# Patient Record
Sex: Male | Born: 1970 | Race: White | Hispanic: Yes | Marital: Married | Smoking: Never smoker
Health system: Southern US, Community
[De-identification: ages and names within clinical notes are randomized; demographics above are authoritative.]

## PROBLEM LIST (undated history)

## (undated) DIAGNOSIS — E119 Type 2 diabetes mellitus without complications: Secondary | ICD-10-CM

## (undated) DIAGNOSIS — I10 Essential (primary) hypertension: Secondary | ICD-10-CM

## (undated) DIAGNOSIS — E7800 Pure hypercholesterolemia, unspecified: Secondary | ICD-10-CM

## (undated) HISTORY — PX: SHOULDER OPEN ROTATOR CUFF REPAIR: SHX2407

## (undated) HISTORY — PX: BACK SURGERY: SHX140

## (undated) HISTORY — PX: KNEE SURGERY: SHX244

---

## 1999-11-16 ENCOUNTER — Ambulatory Visit (HOSPITAL_COMMUNITY): Admission: RE | Admit: 1999-11-16 | Discharge: 1999-11-16 | Payer: Self-pay | Admitting: Family Medicine

## 1999-11-16 ENCOUNTER — Encounter: Payer: Self-pay | Admitting: Family Medicine

## 2000-01-12 ENCOUNTER — Encounter: Payer: Self-pay | Admitting: Orthopedic Surgery

## 2000-01-12 ENCOUNTER — Encounter: Admission: RE | Admit: 2000-01-12 | Discharge: 2000-01-12 | Payer: Self-pay | Admitting: Orthopedic Surgery

## 2001-11-29 ENCOUNTER — Encounter: Payer: Self-pay | Admitting: Emergency Medicine

## 2001-11-29 ENCOUNTER — Emergency Department (HOSPITAL_COMMUNITY): Admission: EM | Admit: 2001-11-29 | Discharge: 2001-11-29 | Payer: Self-pay | Admitting: Emergency Medicine

## 2002-06-14 ENCOUNTER — Emergency Department (HOSPITAL_COMMUNITY): Admission: AC | Admit: 2002-06-14 | Discharge: 2002-06-14 | Payer: Self-pay

## 2002-06-14 ENCOUNTER — Encounter: Payer: Self-pay | Admitting: Emergency Medicine

## 2002-11-07 ENCOUNTER — Encounter: Payer: Self-pay | Admitting: Family Medicine

## 2002-11-07 ENCOUNTER — Encounter: Admission: RE | Admit: 2002-11-07 | Discharge: 2002-11-07 | Payer: Self-pay | Admitting: Family Medicine

## 2005-09-06 ENCOUNTER — Emergency Department (HOSPITAL_COMMUNITY): Admission: EM | Admit: 2005-09-06 | Discharge: 2005-09-07 | Payer: Self-pay | Admitting: Emergency Medicine

## 2006-09-17 ENCOUNTER — Emergency Department (HOSPITAL_COMMUNITY): Admission: EM | Admit: 2006-09-17 | Discharge: 2006-09-17 | Payer: Self-pay | Admitting: Emergency Medicine

## 2007-01-31 ENCOUNTER — Inpatient Hospital Stay (HOSPITAL_COMMUNITY): Admission: RE | Admit: 2007-01-31 | Discharge: 2007-02-02 | Payer: Self-pay | Admitting: Neurosurgery

## 2007-05-10 ENCOUNTER — Encounter: Admission: RE | Admit: 2007-05-10 | Discharge: 2007-05-10 | Payer: Self-pay | Admitting: Gastroenterology

## 2011-04-14 NOTE — Op Note (Signed)
[name], [name]                 ACCOUNT NO.:  [id number]   MEDICAL RECORD NO.:  [id number]          PATIENT TYPE:  INP   LOCATION:  3172                         FACILITY:  [facility]   PHYSICIAN:  Reinaldo Meeker, M.D. DATE OF BIRTH:  [birth year]   DATE OF PROCEDURE:  01/31/2007  DATE OF DISCHARGE:                               OPERATIVE REPORT   PREOPERATIVE DIAGNOSIS:  Herniated disc and degenerative disc disease L4-  L5 and L5-S1.   POSTOPERATIVE DIAGNOSIS:  Herniated disc and degenerative disc disease  L4-L5 and L5-S1.   PROCEDURE:  L4-L5 and L5-S1 bilateral complete laminectomy followed by  L4-L5 and L5-S1 bilateral microdiscectomy followed by L4-L5 and L5-S1  posterior lumbar interbody fusion with Nuvasive bony spacers and PEEK  interbody cage at each level followed by segmental pedicle screw  sensation L4-L5 and L5-S1 followed by L4-L5 and L5-S1 posterolateral  fusion.   SECONDARY PROCEDURE:  Microsection L4-L5 and L5-S1 disc as well as L4,  L5, and S1 nerve roots.   SURGEON:  Reinaldo Meeker, M.D.   ASSISTANT:  Donalee Citrin, M.D.   PROCEDURE IN DETAIL:  After being placed in the prone position, the  patient's back was prepped and draped in the usual sterile fashion.  Localizing fluoroscopy was used prior to incision to identify the  appropriate level.  A midline incision was made over the spinous process  of L3, L4, L5, and S1.  Using Bovie cutting current, the incision was  carried down to the spinous processes.  Subperiosteal dissection was  then carried out bilaterally in the spinous process and lamina facet  joint to expose the transverse processes of L4 and L5 as well as the far  lateral region of S1.  A self-retaining retractor was placed for  exposure and x-rays showed we approached the appropriate level.  The  spinous processes of L4, L5, and S1 were then removed and the bone saved  for use later in the case.   Starting on the patient left side, generous  laminotomy was performed at  L4-L5 and L5-S1 by removing the inferior 80% of the L4 lamina, the  medial 2/3 of the facet joint, all of the L5 lamina, and superior 1/2 of  the S1 lamina.  Residual bone was removed and saved for use later in the  case.  The ligamentum flavum was removed in a piecemeal fashion.  L5 and  S1 nerve roots were identified easily.  Dissection was then carried out  further to identify the L4 nerve and more decompression was carried out  to do this than was needed for the posterior lumbar interbody fusion.  A  similar decompression was then carried out on the opposite side, once  again removing the inferior 80% of the L4 lamina, the medial 2/3 of the  facet joint at L4, L5, and S1, as well as all of the L5 lamina and the  superior 1/2 of the S1 lamina.  Once again, the L4, L5, and S1 nerve  roots were identified and more decompression was carried out to identify  the L4 than was  needed simply for the posterior lumbar interbody fusion.   At this time, bilateral microdiscectomy was carried out at L4-L5 and L5-  S1.  The annulus was incised after coagulation and thoroughly cleaned  out with pituitary rongeurs and curets.  A similar decompression was  carried out bilaterally at both levels until the disc space was well  decompressed as was the nerve roots and the discs were then prepared for  posterior lumbar interbody fusion.  Starting at L4-L5, the disc was  distracted up to a 10 mm size.  On the opposite side, a rotating cutter  followed by box chiseling with a 10 x 9 chisel was carried out.  A bony  spacer of 10 x 9 x 25 mm depth was placed and found to be in good  position under fluoroscopy.  On the second side, a similar preparation  was carried out.  Prior to placing the second spacer, autologous bone  and BMP were placed in the midline.  On the second side, a PEEK cage  with autologous bone and putty were placed.   Attention was then turned to L5-S1.  Once  again, the disc was distracted  up to a 10 mm size.  Once again, scraping and chiseling was carried out  followed by placing first a cage and second, the bony spacer on the  opposite side.  Once again, prior to placing the second spacer,  autologous bone, Actifuse putty, and BMP were placed in the midline.  Once again, fluoroscopy showed the spacers to be in good position.  Once  again, a PEEK spacer on one side and a bony spacer on the other were  used.  Pedicle screw fixation was then carried out.  Drill holes were  placed for starting followed by placement of an awl, tapping, and  placing screws.  45 mm screws were placed bilaterally at L4.  On the  right side at L5, a 45 mm screws was placed, and a 40 mm screw was  placed on the left.  35 mm screws were placed at S1 bilaterally.   After irrigating once more, posterolateral fusion was carried out.  A  high speed drill was used to decorticate the far lateral region and a  mixture of Actifuse, autologous bone, and BMP were placed.  The rods  were then placed on the screw heads and sequentially tightened.  Sequential compression was carried out bilaterally.  A crosslink was  then placed.  Final fluoroscopy in AP and lateral direction showed the  screws, rods, and crosslink to all be in good position as well as the  bony spacers.  Large amounts of irrigation were carried out at this  time.  Any bleeding was controlled with bipolar coagulation and Gelfoam.  A Hemovac drain was brought out through a separate stab wound incision  and left in the epidural space.  The wound was then closed in multiple  layers of Vicryl in the muscle, fascia, subcutaneous, and subcuticular  tissues.  Staples were placed on the skin.  Sterile dressings were then  applied and the patient was extubated and taken to the recovery room in  stable condition.           ______________________________  Reinaldo Meeker, M.D.    ROK/MEDQ  D:  01/31/2007  T:   01/31/2007  Job:  [id number]

## 2016-08-11 ENCOUNTER — Encounter (HOSPITAL_COMMUNITY): Payer: Self-pay | Admitting: Emergency Medicine

## 2016-08-11 ENCOUNTER — Emergency Department (HOSPITAL_COMMUNITY): Payer: BC Managed Care – PPO

## 2016-08-11 ENCOUNTER — Emergency Department (HOSPITAL_COMMUNITY)
Admission: EM | Admit: 2016-08-11 | Discharge: 2016-08-11 | Disposition: A | Discharge status: Home / Self Care | Payer: BC Managed Care – PPO | Attending: Emergency Medicine | Admitting: Emergency Medicine

## 2016-08-11 DIAGNOSIS — E119 Type 2 diabetes mellitus without complications: Secondary | ICD-10-CM | POA: Diagnosis not present

## 2016-08-11 DIAGNOSIS — Z7984 Long term (current) use of oral hypoglycemic drugs: Secondary | ICD-10-CM | POA: Insufficient documentation

## 2016-08-11 DIAGNOSIS — I10 Essential (primary) hypertension: Secondary | ICD-10-CM | POA: Insufficient documentation

## 2016-08-11 DIAGNOSIS — R079 Chest pain, unspecified: Secondary | ICD-10-CM | POA: Insufficient documentation

## 2016-08-11 HISTORY — DX: Essential (primary) hypertension: I10

## 2016-08-11 HISTORY — DX: Type 2 diabetes mellitus without complications: E11.9

## 2016-08-11 HISTORY — DX: Pure hypercholesterolemia, unspecified: E78.00

## 2016-08-11 LAB — I-STAT TROPONIN, ED
Troponin i, poc: 0.01 ng/mL (ref 0.00–0.08)
Troponin i, poc: 0.01 ng/mL (ref 0.00–0.08)

## 2016-08-11 LAB — BASIC METABOLIC PANEL
Anion gap: 12 (ref 5–15)
BUN: 9 mg/dL (ref 6–20)
CO2: 25 mmol/L (ref 22–32)
Calcium: 9.9 mg/dL (ref 8.9–10.3)
Chloride: 103 mmol/L (ref 101–111)
Creatinine, Ser: 0.86 mg/dL (ref 0.61–1.24)
GFR calc Af Amer: >60 mL/min (ref >60)
GFR calc non Af Amer: >60 mL/min (ref >60)
Glucose, Bld: 153 mg/dL — ABNORMAL HIGH (ref 65–99)
Potassium: 3.5 mmol/L (ref 3.5–5.1)
Sodium: 140 mmol/L (ref 135–145)

## 2016-08-11 LAB — CBC
HCT: 42.6 % (ref 39.0–52.0)
Hemoglobin: 14 g/dL (ref 13.0–17.0)
MCH: 25.2 pg — ABNORMAL LOW (ref 26.0–34.0)
MCHC: 32.9 g/dL (ref 30.0–36.0)
MCV: 76.8 fL — ABNORMAL LOW (ref 78.0–100.0)
Platelets: 257 10*3/uL (ref 150–400)
RBC: 5.55 MIL/uL (ref 4.22–5.81)
RDW: 14.5 % (ref 11.5–15.5)
WBC: 10 10*3/uL (ref 4.0–10.5)

## 2016-08-11 MED ORDER — ASPIRIN 81 MG PO CHEW
324.0000 mg | CHEWABLE_TABLET | Freq: Once | ORAL | Status: AC
  Administered 2016-08-11: 324 mg via ORAL
  Filled 2016-08-11: qty 4

## 2016-08-11 NOTE — ED Notes (Signed)
Pt departed in NAD, refused use of wheelchair.  

## 2016-08-11 NOTE — ED Triage Notes (Signed)
Mid cp that started this am a little sob , pain comes and goes no n/v

## 2016-08-11 NOTE — ED Provider Notes (Signed)
[facility] Provider Note   CSN: [id number] Arrival date & time: 08/11/16  1408     History   Chief Complaint Chief Complaint  Patient presents with  . Chest Pain    HPI [name] is a 45 y.o. male.  The history is provided by the patient.  Chest Pain   This is a new problem. The current episode started 12 to 24 hours ago. The problem occurs constantly. The problem has not changed since onset.Pain location: left chest. The pain is mild. The quality of the pain is described as pressure-like. The pain does not radiate. Episode Length: several hours. Associated symptoms include shortness of breath. Pertinent negatives include no cough, no fever, no hemoptysis, no lower extremity edema, no syncope and no vomiting. Risk factors include male gender.  His past medical history is significant for diabetes, hyperlipidemia and hypertension.  Pertinent negatives for past medical history include no CAD and no PE.  His family medical history is significant for early MI.   Pt reports onset of left sided well localized chest pressure Worse with breathing It is not worse with exertion No fever/vomiting He reports mild SOB No h/o CAD/PE  fam hx - +for CAD Past Medical History:  Diagnosis Date  . Diabetes mellitus without complication (HCC)   . High cholesterol   . Hypertension     There are no active problems to display for this patient.   Past Surgical History:  Procedure Laterality Date  . BACK SURGERY    . SHOULDER OPEN ROTATOR CUFF REPAIR         Home Medications    Prior to Admission medications   Medication Sig Start Date End Date Taking? Authorizing Provider  fexofenadine (ALLEGRA) 180 MG tablet Take 180 mg by mouth daily. 07/17/16  Yes Historical Provider, MD  JANUVIA 100 MG tablet Take 100 mg by mouth daily. 07/09/16  Yes Historical Provider, MD  metFORMIN (GLUCOPHAGE) 1000 MG tablet Take 1,000 mg by mouth 2 (two) times daily. 07/22/16  Yes Historical  Provider, MD  nisoldipine (SULAR) 20 MG 24 hr tablet Take 20 mg by mouth daily. 07/02/16  Yes Historical Provider, MD  rosuvastatin (CRESTOR) 5 MG tablet Take 5 mg by mouth at bedtime. 07/02/16  Yes Historical Provider, MD  valsartan-hydrochlorothiazide (DIOVAN-HCT) 160-25 MG tablet Take 1 tablet by mouth daily. 07/26/16  Yes Historical Provider, MD    Family History No family history on file.  Social History Social History  Substance Use Topics  . Smoking status: Never Smoker  . Smokeless tobacco: Never Used  . Alcohol use Not on file     Allergies   Morphine and related   Review of Systems Review of Systems  Constitutional: Negative for fever.  Respiratory: Positive for shortness of breath. Negative for cough and hemoptysis.   Cardiovascular: Positive for chest pain. Negative for syncope.  Gastrointestinal: Negative for vomiting.  All other systems reviewed and are negative.    Physical Exam Updated Vital Signs BP 137/93   Pulse (!) 52   Temp 97.7 F (36.5 C) (Oral)   Resp 17   SpO2 100%   Physical Exam  CONSTITUTIONAL: Well developed/well nourished HEAD: Normocephalic/atraumatic EYES: EOMI/PERRL ENMT: Mucous membranes moist NECK: supple no meningeal signs SPINE/BACK:entire spine nontender CV: S1/S2 noted, no murmurs/rubs/gallops noted LUNGS: Lungs are clear to auscultation bilaterally, no apparent distress ABDOMEN: soft, nontender, no rebound or guarding, bowel sounds noted throughout abdomen GU:no cva tenderness NEURO: Pt is awake/alert/appropriate, moves all extremitiesx4.  No facial droop.   EXTREMITIES: pulses normal/equalx4, full ROM, no calf tenderness or edema SKIN: warm, color normal PSYCH: no abnormalities of mood noted, alert and oriented to situation  ED Treatments / Results  Labs (all labs ordered are listed, but only abnormal results are displayed) Labs Reviewed  BASIC METABOLIC PANEL - Abnormal; Notable for the following:       Result Value     Glucose, Bld 153 (*)    All other components within normal limits  CBC - Abnormal; Notable for the following:    MCV 76.8 (*)    MCH 25.2 (*)    All other components within normal limits  I-STAT TROPOININ, ED  I-STAT TROPOININ, ED    EKG  EKG Interpretation  Date/Time:  Friday August 11 2016 14:17:30 EDT Ventricular Rate:  77 PR Interval:  146 QRS Duration: 114 QT Interval:  382 QTC Calculation: 432 R Axis:   -30 Text Interpretation:  Normal sinus rhythm Left axis deviation Incomplete right bundle branch block Abnormal ECG No significant change since last tracing Confirmed by Bebe ShaggyWICKLINE  MD, DONALD ([id number]) on 08/11/2016 6:49:47 PM       Radiology Dg Chest 2 View  Result Date: 08/11/2016 CLINICAL DATA:  Chest pain beginning this morning. EXAM: CHEST  2 VIEW COMPARISON:  PA and lateral chest 09/17/2016. FINDINGS: The lungs are clear. Heart size is normal. No pneumothorax or pleural effusion. No bony abnormality. IMPRESSION: No acute disease. Electronically Signed   By: Drusilla Kannerhomas  Dalessio M.D.   On: 08/11/2016 15:06    Procedures Procedures (including critical care time)  Medications Ordered in ED Medications  aspirin chewable tablet 324 mg (324 mg Oral Given 08/11/16 2023)     Initial Impression / Assessment and Plan / ED Course  I have reviewed the triage vital signs and the nursing notes.  Pertinent labs & imaging results that were available during my care of the patient were reviewed by me and considered in my medical decision making (see chart for details).  Clinical Course    Pt with HEART score of 3 PERC negative   Pt stable in the ED Repeat troponin unchanged No worsening CP He has PCP f/u in 2 days Discussed strict return precautions Will d/c home   Final Clinical Impressions(s) / ED Diagnoses   Final diagnoses:  Nonspecific chest pain    New Prescriptions New Prescriptions   No medications on file     Zadie Rhineonald Wickline, MD 08/11/16 2317

## 2016-08-11 NOTE — Discharge Instructions (Signed)

## 2017-06-22 IMAGING — DX DG CHEST 2V
2 series · 2 of 2 positions shown · non-contrast
Comparison: PA and lateral chest 09/17/2016.

CLINICAL DATA: Chest pain beginning this morning.

EXAM:
CHEST  2 VIEW

[w chest pa]
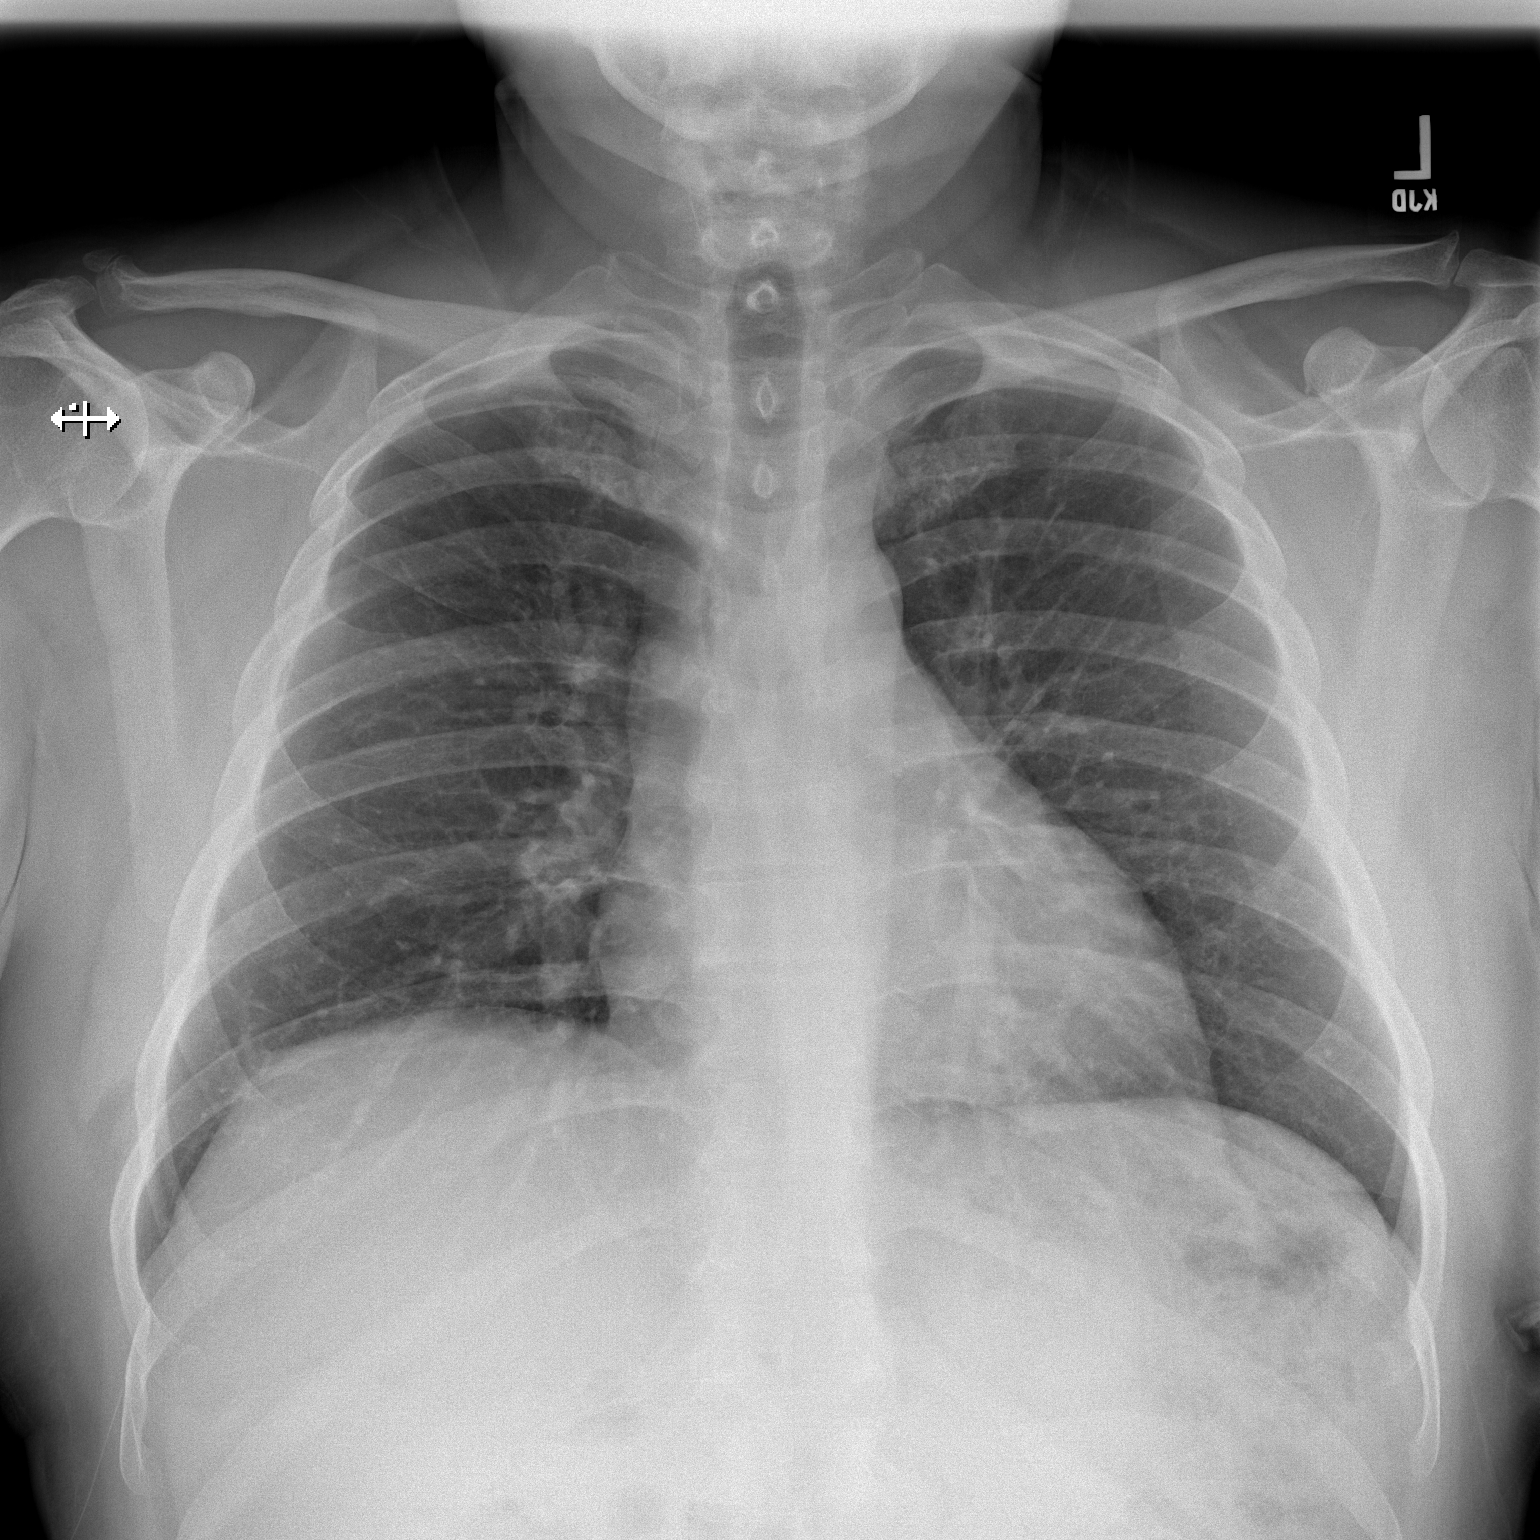

[w chest lat]
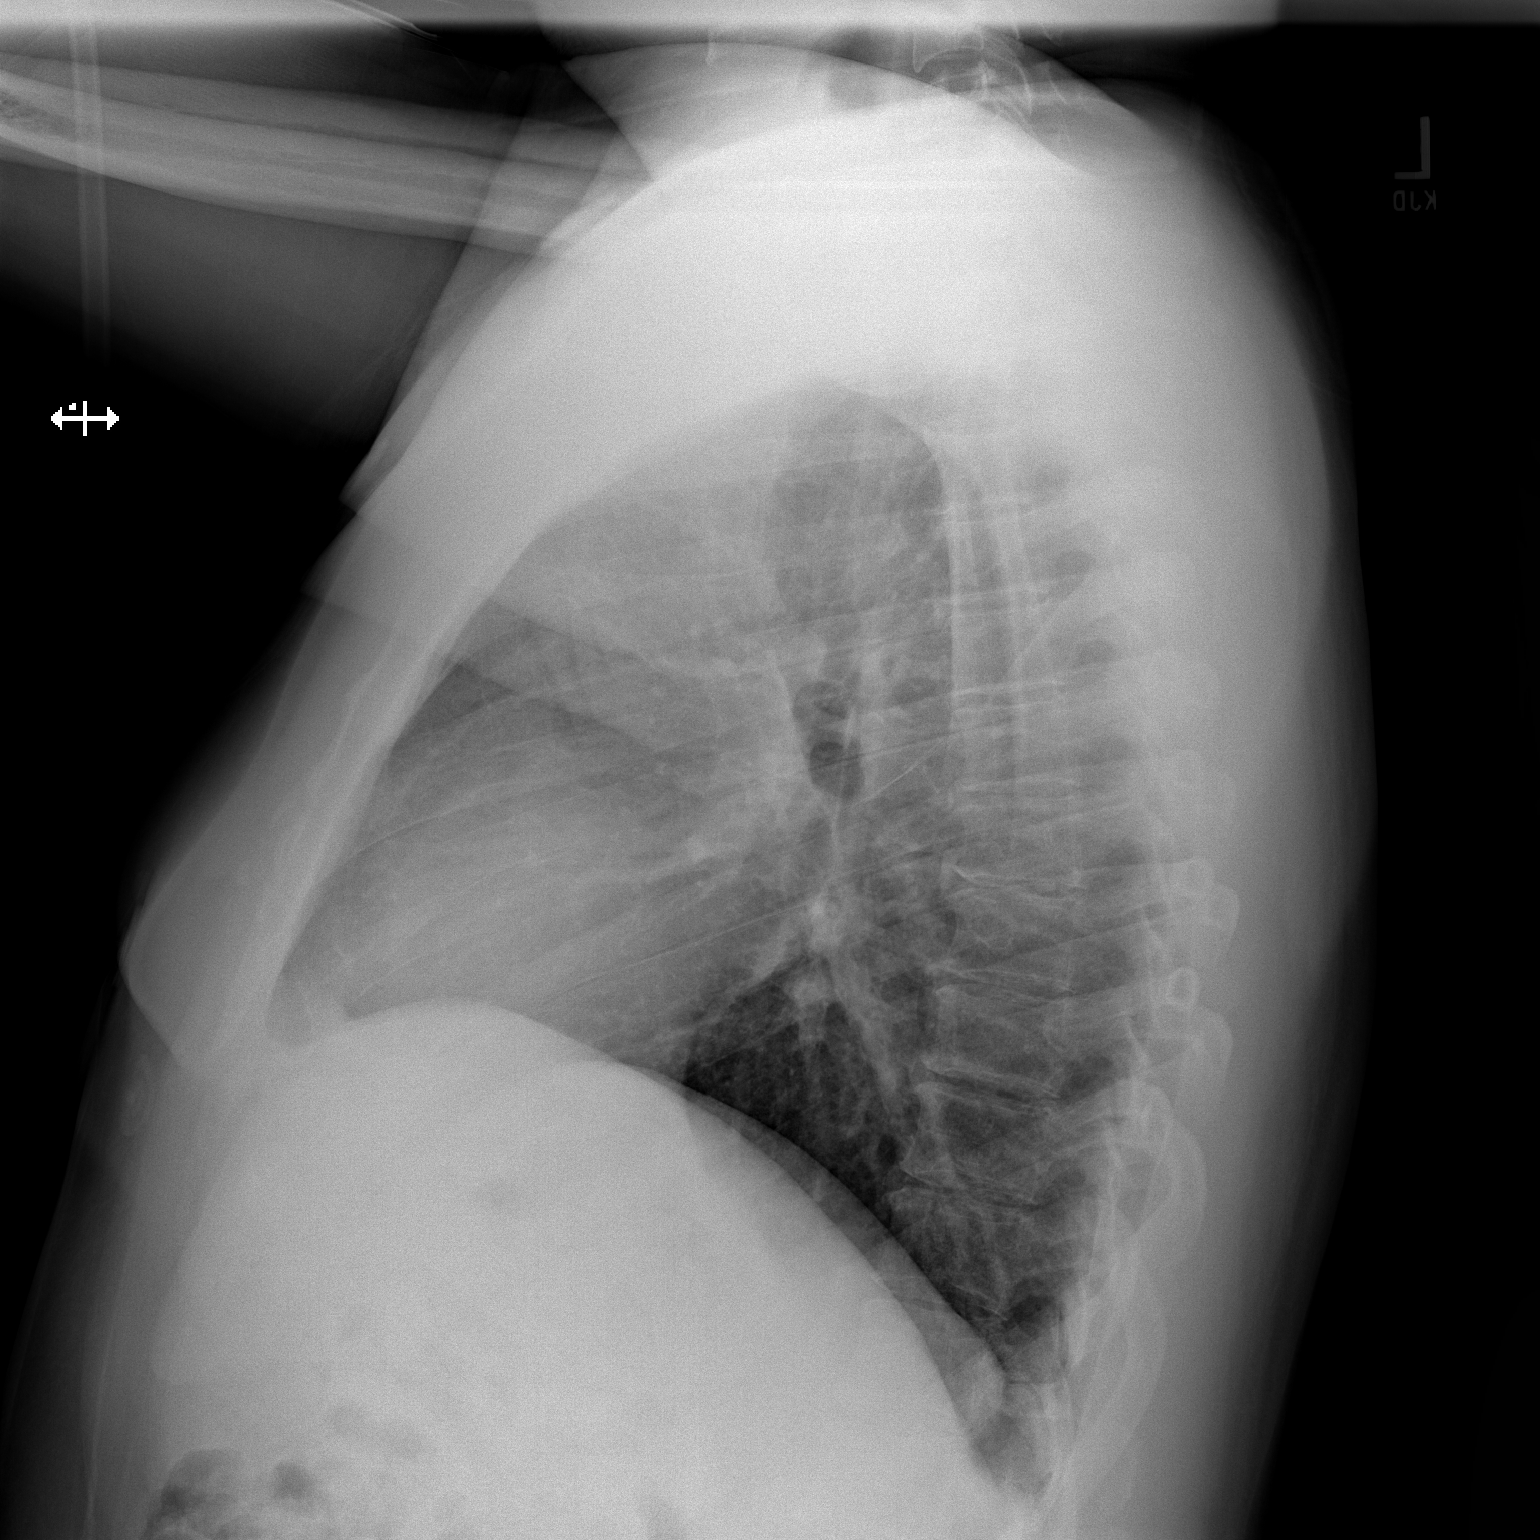

[2 of 2 positions shown; findings below may reference images not displayed]

FINDINGS: The lungs are clear. Heart size is normal. No pneumothorax or
pleural effusion. No bony abnormality.
IMPRESSION: No acute disease.

## 2018-11-21 ENCOUNTER — Other Ambulatory Visit (HOSPITAL_COMMUNITY)
Admission: RE | Admit: 2018-11-21 | Discharge: 2018-11-21 | Disposition: A | Discharge status: Home / Self Care | Payer: BC Managed Care – PPO | Source: Ambulatory Visit | Attending: Family Medicine | Admitting: Family Medicine

## 2018-11-21 ENCOUNTER — Other Ambulatory Visit: Payer: Self-pay

## 2018-11-21 DIAGNOSIS — Z113 Encounter for screening for infections with a predominantly sexual mode of transmission: Secondary | ICD-10-CM | POA: Diagnosis present

## 2018-11-25 LAB — URINE CYTOLOGY ANCILLARY ONLY
Bacterial vaginitis: NEGATIVE
Candida vaginitis: NEGATIVE
Chlamydia: NEGATIVE
Neisseria Gonorrhea: NEGATIVE
Trichomonas: NEGATIVE

## 2020-02-18 ENCOUNTER — Other Ambulatory Visit: Payer: Self-pay

## 2020-02-18 ENCOUNTER — Ambulatory Visit: Payer: BC Managed Care – PPO | Admitting: Medical

## 2020-02-18 ENCOUNTER — Encounter: Payer: Self-pay | Admitting: Medical

## 2020-02-18 VITALS — BP 154/100 | HR 71 | Temp 98.0°F | Ht 66.0 in | Wt 216.4 lb

## 2020-02-18 DIAGNOSIS — I10 Essential (primary) hypertension: Secondary | ICD-10-CM

## 2020-02-18 DIAGNOSIS — IMO0002 Reserved for concepts with insufficient information to code with codable children: Secondary | ICD-10-CM | POA: Insufficient documentation

## 2020-02-18 DIAGNOSIS — E782 Mixed hyperlipidemia: Secondary | ICD-10-CM | POA: Insufficient documentation

## 2020-02-18 DIAGNOSIS — E118 Type 2 diabetes mellitus with unspecified complications: Secondary | ICD-10-CM

## 2020-02-18 DIAGNOSIS — E785 Hyperlipidemia, unspecified: Secondary | ICD-10-CM

## 2020-02-18 DIAGNOSIS — E1165 Type 2 diabetes mellitus with hyperglycemia: Secondary | ICD-10-CM

## 2020-02-18 MED ORDER — VALSARTAN-HYDROCHLOROTHIAZIDE 320-12.5 MG PO TABS: 1.0000 | ORAL_TABLET | Freq: Every day | ORAL | 1 refills | Status: DC

## 2020-02-18 MED ORDER — FARXIGA 5 MG PO TABS: 5.0000 mg | ORAL_TABLET | Freq: Every day | ORAL | 0 refills | Status: DC

## 2020-02-18 MED ORDER — AMLODIPINE BESYLATE 10 MG PO TABS: 10.0000 mg | ORAL_TABLET | Freq: Every day | ORAL | 1 refills | Status: DC

## 2020-02-18 MED ORDER — METFORMIN HCL 1000 MG PO TABS: 1000.0000 mg | ORAL_TABLET | Freq: Two times a day (BID) | ORAL | 3 refills | Status: DC

## 2020-02-18 NOTE — Progress Notes (Signed)
Subjective: Chief Complaint  Patient presents with  . New Patient (Initial Visit)    establish care   . Hypertension  . Diabetes    with fasting blood work    New patient today.  Was seeing Dr. Darlyne Russian prior  He notes history of diabetes x10 years, history of high blood pressure for 15 years.  For the past 2 years his blood pressure has not improved despite medication changes.  Want to get another opinion.  He is having some trouble getting medications and supplies refilled.  Not currently checking blood sugars.  He ran out of many of his medicines in January 2021.  He exercises, tries to eat healthy  Last cardiology visit was years ago.  Home blood pressures typically 160/90  His last hemoglobin A1c was 9% in February 2021  Other medical team includes allergy Dr., And sees physical therapy for his back.  He sees Dr. Latanya Maudlin for orthopedics.  He had a shoulder steroid injection recently.  He is status post prior back surgery    Past Medical History:  Diagnosis Date  . Diabetes mellitus without complication ([place])   . High cholesterol   . Hypertension    Current Outpatient Medications on File Prior to Visit  Medication Sig Dispense Refill  . amLODipine (NORVASC) 5 MG tablet Take 5 mg by mouth daily.    . valsartan-hydrochlorothiazide (DIOVAN-HCT) 160-25 MG tablet Take 1 tablet by mouth daily.  3  . fexofenadine (ALLEGRA) 180 MG tablet Take 180 mg by mouth daily.  4  . JANUVIA 100 MG tablet Take 100 mg by mouth daily.  3  . metFORMIN (GLUCOPHAGE) 1000 MG tablet Take 1,000 mg by mouth 2 (two) times daily.  3  . nisoldipine (SULAR) 20 MG 24 hr tablet Take 20 mg by mouth daily.  4  . rosuvastatin (CRESTOR) 5 MG tablet Take 5 mg by mouth at bedtime.  1   No current facility-administered medications on file prior to visit.   Review of systems as in subjective    Objective BP (!) 154/100   Pulse 71   Temp 98 F (36.7 C)   Ht 5\' 6"  (1.676 m)   Wt 216 lb 6.4 oz (98.2 kg)    SpO2 97%   BMI 34.93 kg/m   General appearence: alert, no distress, WD/WN, [place] American male, very pleasant Neck: supple, no lymphadenopathy, no thyromegaly, no masses, no bruit Heart: RRR, normal S1, S2, no murmurs Lungs: CTA bilaterally, no wheezes, rhonchi, or rales Abdomen: +bs, soft, non tender, non distended, no masses, no hepatomegaly, no splenomegaly Pulses: 2+ symmetric, upper and lower extremities, normal cap refill Extremities no edema    Assessment: Encounter Diagnoses  Name Primary?  . Diabetes mellitus type 2 with complications, uncontrolled ([place]) Yes  . Essential hypertension, benign   . Hyperlipidemia, unspecified hyperlipidemia type     Plan: We discussed his health history, medications.  Discussed the problem he has had getting blood pressure under control.  Discussed diet, exercise, need to continue efforts to lose weight.  Limit salt intake.  I will increase doses of both blood pressure medications today  Refilled Farxiga and Metformin as he has been out of these.  Advised he get back to glucose testing.  He has glucose testing supplies at home    [facility] was seen today for new patient (initial visit), hypertension and diabetes.  Diagnoses and all orders for this visit:  Diabetes mellitus type 2 with complications, uncontrolled ([place]) -  Comprehensive metabolic panel -     Hemoglobin A1c  Essential hypertension, benign -     Comprehensive metabolic panel  Hyperlipidemia, unspecified hyperlipidemia type  Other orders -     valsartan-hydrochlorothiazide (DIOVAN-HCT) 320-12.5 MG tablet; Take 1 tablet by mouth daily. -     amLODipine (NORVASC) 10 MG tablet; Take 1 tablet (10 mg total) by mouth daily. -     metFORMIN (GLUCOPHAGE) 1000 MG tablet; Take 1 tablet (1,000 mg total) by mouth 2 (two) times daily with a meal. -     dapagliflozin propanediol (FARXIGA) 5 MG TABS tablet; Take 5 mg by mouth daily before breakfast.  Follow-up pending labs, likely  recheck in 1 to 3 months

## 2020-02-19 ENCOUNTER — Other Ambulatory Visit: Payer: Self-pay | Admitting: Medical

## 2020-02-19 LAB — COMPREHENSIVE METABOLIC PANEL
ALT: 41 IU/L (ref 0–44)
AST: 27 IU/L (ref 0–40)
Albumin/Globulin Ratio: 2 (ref 1.2–2.2)
Albumin: 4.9 g/dL (ref 4.0–5.0)
Alkaline Phosphatase: 102 IU/L (ref 39–117)
BUN/Creatinine Ratio: 10 (ref 9–20)
BUN: 9 mg/dL (ref 6–24)
Bilirubin Total: 0.5 mg/dL (ref 0.0–1.2)
CO2: 27 mmol/L (ref 20–29)
Calcium: 9.6 mg/dL (ref 8.7–10.2)
Chloride: 97 mmol/L (ref 96–106)
Creatinine, Ser: 0.9 mg/dL (ref 0.76–1.27)
GFR calc Af Amer: 116 mL/min/{1.73_m2} (ref >59)
GFR calc non Af Amer: 100 mL/min/{1.73_m2} (ref >59)
Globulin, Total: 2.4 g/dL (ref 1.5–4.5)
Glucose: 248 mg/dL — ABNORMAL HIGH (ref 65–99)
Potassium: 3.4 mmol/L — ABNORMAL LOW (ref 3.5–5.2)
Sodium: 141 mmol/L (ref 134–144)
Total Protein: 7.3 g/dL (ref 6.0–8.5)

## 2020-02-19 LAB — HEMOGLOBIN A1C
Est. average glucose Bld gHb Est-mCnc: 214 mg/dL
Hgb A1c MFr Bld: 9.1 % — ABNORMAL HIGH (ref 4.8–5.6)

## 2020-02-19 MED ORDER — JANUVIA 100 MG PO TABS: 100.0000 mg | ORAL_TABLET | Freq: Every day | ORAL | 0 refills | Status: DC

## 2020-02-19 MED ORDER — ROSUVASTATIN CALCIUM 5 MG PO TABS: 5.0000 mg | ORAL_TABLET | Freq: Every day | ORAL | 3 refills | Status: DC

## 2020-02-19 NOTE — Progress Notes (Unsigned)
Labs show blood sugar over 240, hemoglobin A1c 9.1%  Recommendations  Diabetes  Continue Metformin 1000 mg twice daily  For now, continue Januvia 100 mg daily  Add the [place] 5 mg once daily.  If this is too expensive let me know right away.  The benefits of this medicine is to improve sugars, to help lose weight, and to reduce risk of heart disease  I recommend he be drinking at least 2 L of water per day particular with the [place]  Eat a low-carb low-fat diet and get exercise regularly  High blood pressure  Yesterday I increased the amlodipine to 10 mg daily  Yesterday I changed his other blood pressure pill to 320/12.5 mg daily valsartan HCT  So stop the current doses and switch to these  High cholesterol I refilled Crestor 5 mg daily at bedtime  Start this regimen, check blood sugars at least a few days per week fasting with goal less than 130  Lets follow-up in 6 to 8 weeks either for med check or fasting physical

## 2020-03-22 ENCOUNTER — Encounter: Payer: Self-pay | Admitting: Medical

## 2020-03-22 ENCOUNTER — Other Ambulatory Visit: Payer: Self-pay

## 2020-03-22 ENCOUNTER — Ambulatory Visit: Payer: BC Managed Care – PPO | Admitting: Medical

## 2020-03-22 VITALS — BP 140/88 | HR 79 | Temp 98.5°F | Ht 66.0 in | Wt 223.6 lb

## 2020-03-22 DIAGNOSIS — N529 Male erectile dysfunction, unspecified: Secondary | ICD-10-CM | POA: Diagnosis not present

## 2020-03-22 DIAGNOSIS — E785 Hyperlipidemia, unspecified: Secondary | ICD-10-CM | POA: Diagnosis not present

## 2020-03-22 DIAGNOSIS — I10 Essential (primary) hypertension: Secondary | ICD-10-CM

## 2020-03-22 DIAGNOSIS — N4889 Other specified disorders of penis: Secondary | ICD-10-CM

## 2020-03-22 DIAGNOSIS — IMO0002 Reserved for concepts with insufficient information to code with codable children: Secondary | ICD-10-CM

## 2020-03-22 DIAGNOSIS — E118 Type 2 diabetes mellitus with unspecified complications: Secondary | ICD-10-CM

## 2020-03-22 DIAGNOSIS — Z113 Encounter for screening for infections with a predominantly sexual mode of transmission: Secondary | ICD-10-CM

## 2020-03-22 DIAGNOSIS — E1165 Type 2 diabetes mellitus with hyperglycemia: Secondary | ICD-10-CM

## 2020-03-22 MED ORDER — SILDENAFIL CITRATE 20 MG PO TABS: 60.0000 mg | ORAL_TABLET | Freq: Every day | ORAL | 0 refills | Status: DC | PRN

## 2020-03-22 NOTE — Patient Instructions (Signed)
Overall, thing look better today  Continue your current medications.    Your blood pressure is improved today, 140/88.   Check blood pressure at home 2 days per week.  Goal is 130/80  Check blood sugars at home.  Goal is fasting morning glucose <130.   Exercise regular  Try and lose 10lb in the next 3 months.  Eat a healthy low fat, low sugar diet.  Talk to wife about whether she sees you quit breathing in your sleep.    If you snore loud, have a neck circumference > 17", this is risk for sleep apnea.   Sleep Apnea Sleep apnea affects breathing during sleep. It causes breathing to stop for a short time or to become shallow. It can also increase the risk of:  Heart attack.  Stroke.  Being very overweight (obese).  Diabetes.  Heart failure.  Irregular heartbeat. The goal of treatment is to help you breathe normally again. What are the causes? There are three kinds of sleep apnea:  Obstructive sleep apnea. This is caused by a blocked or collapsed airway.  Central sleep apnea. This happens when the brain does not send the right signals to the muscles that control breathing.  Mixed sleep apnea. This is a combination of obstructive and central sleep apnea. The most common cause of this condition is a collapsed or blocked airway. This can happen if:  Your throat muscles are too relaxed.  Your tongue and tonsils are too large.  You are overweight.  Your airway is too small. What increases the risk?  Being overweight.  Smoking.  Having a small airway.  Being older.  Being male.  Drinking alcohol.  Taking medicines to calm yourself (sedatives or tranquilizers).  Having family members with the condition. What are the signs or symptoms?  Trouble staying asleep.  Being sleepy or tired during the day.  Getting angry a lot.  Loud snoring.  Headaches in the morning.  Not being able to focus your mind (concentrate).  Forgetting things.  Less interest  in sex.  Mood swings.  Personality changes.  Feelings of sadness (depression).  Waking up a lot during the night to pee (urinate).  Dry mouth.  Sore throat. How is this diagnosed?  Your medical history.  A physical exam.  A test that is done when you are sleeping (sleep study). The test is most often done in a sleep lab but may also be done at home. How is this treated?   Sleeping on your side.  Using a medicine to get rid of mucus in your nose (decongestant).  Avoiding the use of alcohol, medicines to help you relax, or certain pain medicines (narcotics).  Losing weight, if needed.  Changing your diet.  Not smoking.  Using a machine to open your airway while you sleep, such as: ? An oral appliance. This is a mouthpiece that shifts your lower jaw forward. ? A CPAP device. This device blows air through a mask when you breathe out (exhale). ? An EPAP device. This has valves that you put in each nostril. ? A BPAP device. This device blows air through a mask when you breathe in (inhale) and breathe out.  Having surgery if other treatments do not work. It is important to get treatment for sleep apnea. Without treatment, it can lead to:  High blood pressure.  Coronary artery disease.  In men, not being able to have an erection (impotence).  Reduced thinking ability. Follow these instructions at home: Lifestyle  Make changes that your doctor recommends.  Eat a healthy diet.  Lose weight if needed.  Avoid alcohol, medicines to help you relax, and some pain medicines.  Do not use any products that contain nicotine or tobacco, such as cigarettes, e-cigarettes, and chewing tobacco. If you need help quitting, ask your doctor. General instructions  Take over-the-counter and prescription medicines only as told by your doctor.  If you were given a machine to use while you sleep, use it only as told by your doctor.  If you are having surgery, make sure to tell  your doctor you have sleep apnea. You may need to bring your device with you.  Keep all follow-up visits as told by your doctor. This is important. Contact a doctor if:  The machine that you were given to use during sleep bothers you or does not seem to be working.  You do not get better.  You get worse. Get help right away if:  Your chest hurts.  You have trouble breathing in enough air.  You have an uncomfortable feeling in your back, arms, or stomach.  You have trouble talking.  One side of your body feels weak.  A part of your face is hanging down. These symptoms may be an emergency. Do not wait to see if the symptoms will go away. Get medical help right away. Call your local emergency services (911 in the [place] Do not drive yourself to the hospital. Summary  This condition affects breathing during sleep.  The most common cause is a collapsed or blocked airway.  The goal of treatment is to help you breathe normally while you sleep. This information is not intended to replace advice given to you by your health care provider. Make sure you discuss any questions you have with your health care provider. Document Revised: 08/30/2018 Document Reviewed: 07/09/2018 Elsevier Patient Education  2020 [employer].    Lets plan a physical fasting visit in 3 -4 months.

## 2020-03-22 NOTE — Progress Notes (Signed)
Subjective: Chief Complaint  Patient presents with  . Diabetes    1 month follow up    Here for 67mo f/u.   Last visit he was a new patient to establish care.   He was seeing Dr. Darlyne Russian prior.  Last visit I increased BP med doses to get to goal.  I refilled his diabetes medications  He is compliant with medications listed today.  He notes history of diabetes x10 years, history of high blood pressure for 15 years.  For the past 2 years his blood pressure has not improved despite medication changes.  Want to get another opinion.  He is having some trouble getting medications and supplies refilled.  Not currently checking blood sugars.  He ran out of many of his medicines in January 2021.  He exercises, tries to eat healthy  Last cardiology visit was years ago.  His last hemoglobin A1c was 9% in February 2021  Having trouble getting and keeping erections for over a year.  Tried Viagra in the past but it didn't work great.  Also has burning sensation inside penis.  No discharge, no bleeding.  Wife sometimes notes discomfort.  He has been married 30 years.     Past Medical History:  Diagnosis Date  . Diabetes mellitus without complication ([place])   . High cholesterol   . Hypertension    Current Outpatient Medications on File Prior to Visit  Medication Sig Dispense Refill  . amLODipine (NORVASC) 10 MG tablet Take 1 tablet (10 mg total) by mouth daily. 90 tablet 1  . dapagliflozin propanediol (FARXIGA) 5 MG TABS tablet Take 5 mg by mouth daily before breakfast. 90 tablet 0  . fluticasone (FLONASE) 50 MCG/ACT nasal spray INSTILL 2 SPRAY(S) EVERY DAY BY INTRANASAL ROUTE AS DIRECTED FOR 10 DAYS.    Marland Kitchen JANUVIA 100 MG tablet Take 1 tablet (100 mg total) by mouth daily. 90 tablet 0  . metFORMIN (GLUCOPHAGE) 1000 MG tablet Take 1 tablet (1,000 mg total) by mouth 2 (two) times daily with a meal. 180 tablet 3  . rosuvastatin (CRESTOR) 5 MG tablet Take 1 tablet (5 mg total) by mouth at bedtime. 90  tablet 3  . valsartan-hydrochlorothiazide (DIOVAN-HCT) 320-12.5 MG tablet Take 1 tablet by mouth daily. 90 tablet 1   No current facility-administered medications on file prior to visit.   Review of systems as in subjective    Objective BP 140/88   Pulse 79   Temp 98.5 F (36.9 C)   Ht 5\' 6"  (1.676 m)   Wt 223 lb 9.6 oz (101.4 kg)   SpO2 97%   BMI 36.09 kg/m    BP Readings from Last 3 Encounters:  03/22/20 140/88  02/18/20 (!) 154/100  08/11/16 135/90   Wt Readings from Last 3 Encounters:  03/22/20 223 lb 9.6 oz (101.4 kg)  02/18/20 216 lb 6.4 oz (98.2 kg)    General appearence: alert, no distress, WD/WN, [place] American male, very pleasant Heart: RRR, normal S1, S2, no murmurs Lungs: CTA bilaterally, no wheezes, rhonchi, or rales Pulses: 2+ symmetric, upper and lower extremities, normal cap refill Extremities no edema    Assessment: Encounter Diagnoses  Name Primary?  . Essential hypertension, benign Yes  . Diabetes mellitus type 2 with complications, uncontrolled ([place])   . Hyperlipidemia, unspecified hyperlipidemia type   . Erectile dysfunction, unspecified erectile dysfunction type   . Penile irritation      Plan:  HTN - c/t same medication.  He will work on  lifestyle changes, weight loss  Diabetes - home readings improved. C/t current medication  Hyperlipidemia - consider increasing statin dose next visit  ED, penile irritation - labs today.  Begin trial of Sildenafil. Discussed risk/benefits of medication, proper use of medication.      Deloyd was seen today for diabetes.  Diagnoses and all orders for this visit:  Essential hypertension, benign  Diabetes mellitus type 2 with complications, uncontrolled (HCC)  Hyperlipidemia, unspecified hyperlipidemia type  Erectile dysfunction, unspecified erectile dysfunction type -     Testosterone -     PSA -     Chlamydia/Gonococcus/Trichomonas, NAA  Penile irritation -     PSA -      Chlamydia/Gonococcus/Trichomonas, NAA  Other orders -     sildenafil (REVATIO) 20 MG tablet; Take 3 tablets (60 mg total) by mouth daily as needed.  Follow-up pending labs and CPX fasting in 84mo

## 2020-03-23 ENCOUNTER — Other Ambulatory Visit: Payer: Self-pay | Admitting: Medical

## 2020-03-23 DIAGNOSIS — N529 Male erectile dysfunction, unspecified: Secondary | ICD-10-CM

## 2020-03-23 DIAGNOSIS — R7989 Other specified abnormal findings of blood chemistry: Secondary | ICD-10-CM

## 2020-03-23 LAB — PSA: Prostate Specific Ag, Serum: 0.4 ng/mL (ref 0.0–4.0)

## 2020-03-23 LAB — TESTOSTERONE: Testosterone: 265 ng/dL (ref 264–916)

## 2020-03-24 ENCOUNTER — Telehealth: Payer: Self-pay

## 2020-03-24 LAB — CHLAMYDIA/GONOCOCCUS/TRICHOMONAS, NAA
Chlamydia by NAA: NEGATIVE
Gonococcus by NAA: NEGATIVE
Trich vag by NAA: NEGATIVE

## 2020-03-24 NOTE — Telephone Encounter (Signed)
I submitted a PA for the pts. Sildenafil 20 mg and it was denied by the insurance company. They denied it because the pt. Has to have a diagnosis of pulmonary arterial hypertension or Raynaud's phenomenon and the pt. Has neither diagnosis.

## 2020-03-24 NOTE — Telephone Encounter (Signed)
Please let him know that the insurance company is being ridiculous about this medication.  He can still get it cash pay without insurance coverage.  I am not sure what the cost will be.  I would check around with [facility] or other local pharmacies about pricing.  He also should download the [employer] app on the phone which compares level pricing

## 2020-03-25 NOTE — Telephone Encounter (Signed)
I called pt. Tried to LM but voicemail was full.

## 2020-05-11 ENCOUNTER — Other Ambulatory Visit: Payer: Self-pay | Admitting: Medical

## 2020-05-14 ENCOUNTER — Other Ambulatory Visit: Payer: Self-pay | Admitting: Medical

## 2020-05-20 ENCOUNTER — Telehealth: Payer: Self-pay | Admitting: Medical

## 2020-05-20 NOTE — Telephone Encounter (Signed)
Schedule physical/preop appt, fasting  I received ortho surgery clearance letter  We will need to do EKG, however, see if he has had any cardiac testing in the last 2 years.   If so, get that info.

## 2020-05-20 NOTE — Telephone Encounter (Signed)
I called pt. Tried to LM but VM was full and unable to LM. He is already scheduled for a fasting CPE on 06/02/20.

## 2020-06-02 ENCOUNTER — Encounter: Payer: BC Managed Care – PPO | Admitting: Medical

## 2020-06-02 ENCOUNTER — Telehealth: Payer: Self-pay | Admitting: Medical

## 2020-06-02 NOTE — Telephone Encounter (Signed)
This patient no showed for their appointment today.Which of the following is necessary for this patient.   A) No follow-up necessary   B) Follow-up urgent. Locate Patient Immediately.   C) Follow-up necessary. Contact patient and Schedule visit in ____ Days.   D) Follow-up Advised. Contact patient and Schedule visit in ____ Days.   E) Please Send no show letter to patient. Charge no show fee if no show was a CPE.    C -please call and find out why they no showed.   They were do not only for med check on chronic disease but also this was supposed to be a surgery preop visit.   The longer they delay the more risk we run of having to push back surgery  Also they need to be sent the letter and charged a no-show fee

## 2020-06-07 ENCOUNTER — Encounter: Payer: Self-pay | Admitting: Medical

## 2020-06-07 NOTE — Telephone Encounter (Signed)
Forwarding to you, I will send the letter if you will call the pt to see why the pt did not show up,

## 2020-06-07 NOTE — Telephone Encounter (Signed)
Please add fee to pt account please

## 2020-06-07 NOTE — Telephone Encounter (Signed)
My main concern on the no show  for med check and visit was that the appt was also a surgery preop that I felt was somewhat urgent

## 2020-06-07 NOTE — Telephone Encounter (Signed)
Patient state he forgot what day it was.

## 2020-06-08 NOTE — Telephone Encounter (Signed)
I sent the letter he no showed for a cpe, so up to you I can tell the pt when he comes in to not worry about it

## 2020-06-23 ENCOUNTER — Telehealth: Payer: Self-pay | Admitting: Medical

## 2020-06-23 NOTE — Telephone Encounter (Signed)
[name], were we able to reschedule him for preop visit from his recent no show?   [facility]-please hold onto his preop form until he comes in

## 2020-06-24 NOTE — Telephone Encounter (Signed)
Pt. Stated he canceled his surgery so he will no longer need the surgery clearance form so you can disregard that form. He is scheduled for a CPE on 07/12/20 and will see you then.

## 2020-07-12 ENCOUNTER — Encounter: Payer: BC Managed Care – PPO | Admitting: Medical

## 2020-07-26 ENCOUNTER — Other Ambulatory Visit: Payer: Self-pay | Admitting: Medical

## 2020-07-27 NOTE — Telephone Encounter (Signed)
I looked back to see when his last visit was in my recommendations. He is past due   you can send 90 with no refill for this medication refill request he is due for fasting follow-up/med check

## 2020-07-28 NOTE — Telephone Encounter (Signed)
Patient has an appointment 08/05/20.

## 2020-08-05 ENCOUNTER — Ambulatory Visit: Payer: BC Managed Care – PPO | Admitting: Medical

## 2020-08-05 ENCOUNTER — Other Ambulatory Visit: Payer: Self-pay

## 2020-08-05 ENCOUNTER — Encounter: Payer: Self-pay | Admitting: Medical

## 2020-08-05 VITALS — BP 150/98 | HR 80 | Ht 66.0 in | Wt 218.8 lb

## 2020-08-05 DIAGNOSIS — Z7189 Other specified counseling: Secondary | ICD-10-CM | POA: Diagnosis not present

## 2020-08-05 DIAGNOSIS — Z7185 Encounter for immunization safety counseling: Secondary | ICD-10-CM | POA: Insufficient documentation

## 2020-08-05 DIAGNOSIS — B354 Tinea corporis: Secondary | ICD-10-CM | POA: Insufficient documentation

## 2020-08-05 DIAGNOSIS — E118 Type 2 diabetes mellitus with unspecified complications: Secondary | ICD-10-CM

## 2020-08-05 DIAGNOSIS — R9431 Abnormal electrocardiogram [ECG] [EKG]: Secondary | ICD-10-CM | POA: Insufficient documentation

## 2020-08-05 DIAGNOSIS — N529 Male erectile dysfunction, unspecified: Secondary | ICD-10-CM | POA: Diagnosis not present

## 2020-08-05 DIAGNOSIS — Z136 Encounter for screening for cardiovascular disorders: Secondary | ICD-10-CM | POA: Insufficient documentation

## 2020-08-05 DIAGNOSIS — Z0000 Encounter for general adult medical examination without abnormal findings: Secondary | ICD-10-CM | POA: Diagnosis not present

## 2020-08-05 DIAGNOSIS — Z23 Encounter for immunization: Secondary | ICD-10-CM | POA: Insufficient documentation

## 2020-08-05 DIAGNOSIS — I10 Essential (primary) hypertension: Secondary | ICD-10-CM | POA: Diagnosis not present

## 2020-08-05 DIAGNOSIS — E1165 Type 2 diabetes mellitus with hyperglycemia: Secondary | ICD-10-CM

## 2020-08-05 DIAGNOSIS — F320 Major depressive disorder, single episode, mild: Secondary | ICD-10-CM | POA: Insufficient documentation

## 2020-08-05 DIAGNOSIS — Z1159 Encounter for screening for other viral diseases: Secondary | ICD-10-CM | POA: Insufficient documentation

## 2020-08-05 DIAGNOSIS — E1169 Type 2 diabetes mellitus with other specified complication: Secondary | ICD-10-CM

## 2020-08-05 DIAGNOSIS — E785 Hyperlipidemia, unspecified: Secondary | ICD-10-CM

## 2020-08-05 DIAGNOSIS — IMO0002 Reserved for concepts with insufficient information to code with codable children: Secondary | ICD-10-CM

## 2020-08-05 MED ORDER — TERBINAFINE HCL 1 % EX CREA: 1.0000 "application " | TOPICAL_CREAM | Freq: Two times a day (BID) | CUTANEOUS | 0 refills | Status: DC

## 2020-08-05 MED ORDER — BUPROPION HCL ER (XL) 150 MG PO TB24: 150.0000 mg | ORAL_TABLET | Freq: Every day | ORAL | 1 refills | Status: DC

## 2020-08-05 NOTE — Progress Notes (Signed)
Subjective:   HPI  [name] is a 49 y.o. male who presents for Chief Complaint  Patient presents with  . Annual Exam    with fasting labs   . Hypothyroidism    Patient Care Team: Iliana Hutt, Cleda Mccreedy as PCP - General (Family Medicine) Allergist    Concerns: Here for physical and possibly preop.  He is supposed to have left shoulder surgery but put this on hold for now  His mother is sick, on hospice, history of stroke, history of Alzheimer's.  He gets sad as sometimes she does not recognize him.  He has been down in his mood of late.  He would like something to use to help with his mood.  No prior medication for mood  Hypertension- compliant with Amlodipine 10mg  daily, Valsartan HCT 320/12.5mg  daily  Diabetes - compliant with Januvia 100mg  daily, Farxiga 5mg  daily, Metfomrin 1000mg  BID.  Checks blood sugars with been running a little high.  hyperlipemia - compliant with Crestor 5mg  daily  He had Covid infection recently in August, and is still seeming to be fatigued trying to get over this.  He certainly is better but had a rough time with it.  He felt like it did affect his mood and energy levels.   Reviewed their medical, surgical, family, social, medication, and allergy history and updated chart as appropriate.  Past Medical History:  Diagnosis Date  . Diabetes mellitus without complication (HCC)   . High cholesterol   . Hypertension     Past Surgical History:  Procedure Laterality Date  . BACK SURGERY    . SHOULDER OPEN ROTATOR CUFF REPAIR      Family History  Problem Relation Age of Onset  . Alzheimer's disease Mother   . Stroke Mother   . Stroke Father   . Heart disease Father 21       died of MI  . Hypertension Father   . Hyperlipidemia Father   . Diabetes Father   . Diabetes Sister   . Diabetes Brother   . Cancer Neg Hx      Current Outpatient Medications:  .  amLODipine (NORVASC) 10 MG tablet, Take 1 tablet (10 mg total) by  mouth daily., Disp: 90 tablet, Rfl: 1 .  FARXIGA 5 MG TABS tablet, TAKE 1 TABLET BY MOUTH EVERY DAY BEFORE BREAKFAST, Disp: 90 tablet, Rfl: 0 .  JANUVIA 100 MG tablet, TAKE 1 TABLET BY MOUTH EVERY DAY, Disp: 30 tablet, Rfl: 2 .  metFORMIN (GLUCOPHAGE) 1000 MG tablet, Take 1 tablet (1,000 mg total) by mouth 2 (two) times daily with a meal., Disp: 180 tablet, Rfl: 3 .  rosuvastatin (CRESTOR) 5 MG tablet, Take 1 tablet (5 mg total) by mouth at bedtime., Disp: 90 tablet, Rfl: 3 .  valsartan-hydrochlorothiazide (DIOVAN-HCT) 320-12.5 MG tablet, TAKE 1 TABLET BY MOUTH EVERY DAY, Disp: 90 tablet, Rfl: 0 .  buPROPion (WELLBUTRIN XL) 150 MG 24 hr tablet, Take 1 tablet (150 mg total) by mouth daily., Disp: 30 tablet, Rfl: 1 .  fluticasone (FLONASE) 50 MCG/ACT nasal spray, INSTILL 2 SPRAY(S) EVERY DAY BY INTRANASAL ROUTE AS DIRECTED FOR 10 DAYS. (Patient not taking: Reported on 08/05/2020), Disp: , Rfl:  .  sildenafil (REVATIO) 20 MG tablet, Take 3 tablets (60 mg total) by mouth daily as needed. (Patient not taking: Reported on 08/05/2020), Disp: 50 tablet, Rfl: 0 .  terbinafine (LAMISIL AT) 1 % cream, Apply 1 application topically 2 (two) times daily., Disp: 30 g, Rfl: 0  Allergies  Allergen Reactions  . Morphine And Related Other (See Comments)    CAUSES SEVERE PRESSURE (FEELING) IN HIS HEAD       Review of Systems Constitutional: -fever, -chills, -sweats, -unexpected weight change, -decreased appetite, +fatigue Allergy: -sneezing, -itching, -congestion Dermatology: -changing moles, --rash, -lumps ENT: -runny nose, -ear pain, -sore throat, -hoarseness, -sinus pain, -teeth pain, - ringing in ears, -hearing loss, -nosebleeds Cardiology: -chest pain, -palpitations, -swelling, -difficulty breathing when lying flat, -waking up short of breath Respiratory: -cough, -shortness of breath, -difficulty breathing with exercise or exertion, -wheezing, -coughing up blood Gastroenterology: -abdominal pain, -nausea,  -vomiting, -diarrhea, -constipation, -blood in stool, -changes in bowel movement, -difficulty swallowing or eating Hematology: -bleeding, -bruising  Musculoskeletal: -joint aches, -muscle aches, -joint swelling, -back pain, -neck pain, -cramping, -changes in gait Ophthalmology: denies vision changes, eye redness, itching, discharge Urology: -burning with urination, -difficulty urinating, -blood in urine, -urinary frequency, -urgency, -incontinence Neurology: -headache, -weakness, -tingling, -numbness, -memory loss, -falls, -dizziness Psychology: +depressed mood, -agitation, -sleep problems Male GU: no testicular mass, pain, no lymph nodes swollen, no swelling, no rash.     Objective:  BP (!) 150/98   Pulse 80   Ht 5\' 6"  (1.676 m)   Wt 218 lb 12.8 oz (99.2 kg)   SpO2 97%   BMI 35.32 kg/m    Wt Readings from Last 3 Encounters:  08/05/20 218 lb 12.8 oz (99.2 kg)  03/22/20 223 lb 9.6 oz (101.4 kg)  02/18/20 216 lb 6.4 oz (98.2 kg)   BP Readings from Last 3 Encounters:  08/05/20 (!) 150/98  03/22/20 140/88  02/18/20 (!) 154/100    General appearance: alert, no distress, WD/WN, Hispanic male Skin: Several skin tags around the neck, on the upper chest, right lateral orbit, pinkish-brown irritated rash along inguinal areas of groin and under pannus of abdomen, suggestive of fungus, no other worrisome lesions HEENT/oral -deferred due to Covid precaution Neck: supple, no lymphadenopathy, no thyromegaly, no masses, normal ROM, no bruits Chest: non tender, normal shape and expansion Heart: RRR, normal S1, S2, no murmurs Lungs: CTA bilaterally, no wheezes, rhonchi, or rales Abdomen: +bs, soft, non tender, non distended, no masses, no hepatomegaly, no splenomegaly, no bruits Back: Lumbar spine surgical scar, non tender, normal ROM, no scoliosis Musculoskeletal: upper extremities non tender, no obvious deformity, normal ROM throughout, lower extremities non tender, no obvious deformity,  normal ROM throughout Extremities: no edema, no cyanosis, no clubbing Pulses: 2+ symmetric, upper and lower extremities, normal cap refill Neurological: alert, oriented x 3, CN2-12 intact, strength normal upper extremities and lower extremities, sensation normal throughout, DTRs 2+ throughout, no cerebellar signs, gait normal Psychiatric: normal affect, behavior normal, pleasant  GU: normal male external genitalia,uncircumcised, nontender, no masses, no hernia, no lymphadenopathy Rectal: deferred   EKG reviewed Abnormal, ST abnormality V4 through V5, right bundle branch block   Assessment and Plan :   Encounter Diagnoses  Name Primary?  . Encounter for health maintenance examination in adult Yes  . Diabetes mellitus type 2 with complications, uncontrolled (HCC)   . Vaccine counseling   . Erectile dysfunction, unspecified erectile dysfunction type   . Hyperlipidemia associated with type 2 diabetes mellitus (HCC)   . Essential hypertension, benign   . Screening for heart disease   . Encounter for hepatitis C screening test for low risk patient   . Need for influenza vaccination   . Need for Td vaccine   . Tinea corporis   . Depression, major, single episode, mild (HCC)   .  Abnormal EKG     Physical exam - discussed and counseled on healthy lifestyle, diet, exercise, preventative care, vaccinations, sick and well care, proper use of emergency dept and after hours care, and addressed their concerns.    Health screening: See your eye doctor yearly for routine vision care. See your dentist yearly for routine dental care including hygiene visits twice yearly.  Cancer screening Colonoscopy:  Referred for screening colonoscopy  Discussed PSA, prostate exam, and prostate cancer screening risks/benefits.      Vaccinations: Advised yearly influenza vaccine Counseled on the influenza virus vaccine.  Vaccine information sheet given.  Influenza vaccine given after consent  obtained.  Counseled on the Td (tetanus, diptheria) vaccine.  Vaccine information sheet given. Td vaccine given after consent obtained.  Advise second Covid vaccine may be in a month or so since he had the first vaccine, then got Covid infection.  Counseled on pneumococcal vaccine as well.  We can defer this for now   Separate significant issues discussed: Given potential for preop and given his overall risk factors, as well as the abnormal EKG today, referral to cardiology   Hypertension-not at goal, follow-up pending labs  Diabetes-not at goal per last visit, updated labs today, continue glucose monitoring, continue current medication  Hyperlipidemia-continue statin, labs today  Depressed mood, expressed my empathy for his situation today with his mom who is on hospice and has Alzheimer's.  Begin Wellbutrin.  Discussed risk and benefits of medicine.  Recheck in 2 weeks  Tinea corporis and tinea crura's-begin Lamisil short-term cream  Erectile dysfunction-continue sildenafil as needed.  We had discussed low testosterone last visit.  We can revisit that going forward but for now his body is to spend time with his mother   Cephas was seen today for annual exam and hypothyroidism.  Diagnoses and all orders for this visit:  Encounter for health maintenance examination in adult -     Basic metabolic panel -     CBC with Differential/Platelet -     Hemoglobin A1c -     Lipid panel -     Hepatitis C antibody -     HIV Antibody (routine testing w rflx) -     EKG 12-Lead -     Microalbumin/Creatinine Ratio, Urine -     Hepatitis B surface antibody,quantitative -     Hepatitis B surface antigen  Diabetes mellitus type 2 with complications, uncontrolled (HCC) -     Hemoglobin A1c -     Microalbumin/Creatinine Ratio, Urine -     Ambulatory referral to Cardiology  Vaccine counseling  Erectile dysfunction, unspecified erectile dysfunction type  Hyperlipidemia associated with type 2  diabetes mellitus (HCC) -     Lipid panel -     Ambulatory referral to Cardiology  Essential hypertension, benign -     Basic metabolic panel -     Ambulatory referral to Cardiology  Screening for heart disease -     EKG 12-Lead  Encounter for hepatitis C screening test for low risk patient -     Hepatitis C antibody -     Hepatitis B surface antibody,quantitative -     Hepatitis B surface antigen  Need for influenza vaccination  Need for Td vaccine  Tinea corporis  Depression, major, single episode, mild (HCC)  Abnormal EKG  Other orders -     buPROPion (WELLBUTRIN XL) 150 MG 24 hr tablet; Take 1 tablet (150 mg total) by mouth daily. -  terbinafine (LAMISIL AT) 1 % cream; Apply 1 application topically 2 (two) times daily. -     Td : Tetanus/diphtheria >7yo Preservative  free -     Flu Vaccine QUAD 6+ mos PF IM (Fluarix Quad PF)    Follow-up pending labs, yearly for physical

## 2020-08-06 ENCOUNTER — Other Ambulatory Visit: Payer: Self-pay | Admitting: Medical

## 2020-08-06 LAB — CBC WITH DIFFERENTIAL/PLATELET
Basophils Absolute: 0.1 10*3/uL (ref 0.0–0.2)
Basos: 1 %
EOS (ABSOLUTE): 0.2 10*3/uL (ref 0.0–0.4)
Eos: 2 %
Hematocrit: 43.2 % (ref 37.5–51.0)
Hemoglobin: 13.9 g/dL (ref 13.0–17.7)
Immature Grans (Abs): 0.2 10*3/uL — ABNORMAL HIGH (ref 0.0–0.1)
Immature Granulocytes: 2 %
Lymphocytes Absolute: 2.7 10*3/uL (ref 0.7–3.1)
Lymphs: 27 %
MCH: 24.6 pg — ABNORMAL LOW (ref 26.6–33.0)
MCHC: 32.2 g/dL (ref 31.5–35.7)
MCV: 77 fL — ABNORMAL LOW (ref 79–97)
Monocytes Absolute: 0.7 10*3/uL (ref 0.1–0.9)
Monocytes: 7 %
Neutrophils Absolute: 6.2 10*3/uL (ref 1.4–7.0)
Neutrophils: 61 %
Platelets: 259 10*3/uL (ref 150–450)
RBC: 5.65 x10E6/uL (ref 4.14–5.80)
RDW: 17.1 % — ABNORMAL HIGH (ref 11.6–15.4)
WBC: 10 10*3/uL (ref 3.4–10.8)

## 2020-08-06 LAB — BASIC METABOLIC PANEL
BUN/Creatinine Ratio: 16 (ref 9–20)
BUN: 13 mg/dL (ref 6–24)
CO2: 26 mmol/L (ref 20–29)
Calcium: 9 mg/dL (ref 8.7–10.2)
Chloride: 102 mmol/L (ref 96–106)
Creatinine, Ser: 0.79 mg/dL (ref 0.76–1.27)
GFR calc Af Amer: 122 mL/min/{1.73_m2} (ref >59)
GFR calc non Af Amer: 105 mL/min/{1.73_m2} (ref >59)
Glucose: 112 mg/dL — ABNORMAL HIGH (ref 65–99)
Potassium: 3.5 mmol/L (ref 3.5–5.2)
Sodium: 141 mmol/L (ref 134–144)

## 2020-08-06 LAB — HEPATITIS B SURFACE ANTIBODY, QUANTITATIVE: Hepatitis B Surf Ab Quant: <3.1 m[IU]/mL — ABNORMAL LOW (ref >9.9)

## 2020-08-06 LAB — LIPID PANEL
Chol/HDL Ratio: 3.5 ratio (ref 0.0–5.0)
Cholesterol, Total: 146 mg/dL (ref 100–199)
HDL: 42 mg/dL (ref >39)
LDL Chol Calc (NIH): 82 mg/dL (ref 0–99)
Triglycerides: 120 mg/dL (ref 0–149)
VLDL Cholesterol Cal: 22 mg/dL (ref 5–40)

## 2020-08-06 LAB — MICROALBUMIN / CREATININE URINE RATIO
Creatinine, Urine: 73.7 mg/dL
Microalb/Creat Ratio: 32 mg/g creat — ABNORMAL HIGH (ref 0–29)
Microalbumin, Urine: 23.4 ug/mL

## 2020-08-06 LAB — HEPATITIS C ANTIBODY: Hep C Virus Ab: <0.1 s/co ratio (ref 0.0–0.9)

## 2020-08-06 LAB — HEMOGLOBIN A1C
Est. average glucose Bld gHb Est-mCnc: 143 mg/dL
Hgb A1c MFr Bld: 6.6 % — ABNORMAL HIGH (ref 4.8–5.6)

## 2020-08-06 LAB — HEPATITIS B SURFACE ANTIGEN: Hepatitis B Surface Ag: NEGATIVE

## 2020-08-06 LAB — HIV ANTIBODY (ROUTINE TESTING W REFLEX): HIV Screen 4th Generation wRfx: NONREACTIVE

## 2020-08-06 MED ORDER — JANUVIA 100 MG PO TABS: 100.0000 mg | ORAL_TABLET | Freq: Every day | ORAL | 3 refills | Status: DC

## 2020-08-06 MED ORDER — AMLODIPINE BESYLATE 10 MG PO TABS: 10.0000 mg | ORAL_TABLET | Freq: Every day | ORAL | 3 refills | Status: DC

## 2020-08-06 MED ORDER — DAPAGLIFLOZIN PROPANEDIOL 10 MG PO TABS: 10.0000 mg | ORAL_TABLET | Freq: Every day | ORAL | 0 refills | Status: DC

## 2020-08-06 MED ORDER — ROSUVASTATIN CALCIUM 10 MG PO TABS: 10.0000 mg | ORAL_TABLET | Freq: Every day | ORAL | 3 refills | Status: DC

## 2020-08-07 ENCOUNTER — Other Ambulatory Visit: Payer: Self-pay | Admitting: Medical

## 2020-08-23 ENCOUNTER — Telehealth: Payer: Self-pay | Admitting: Medical

## 2020-08-23 NOTE — Telephone Encounter (Signed)
Send back form to [facility] about surgery clearance.  He will still need to see cardiology for cardiology Clearance.  Make sure referral is set and he has appt time soon.  RE: abnormal EKG, HTN, diabetes

## 2020-08-27 ENCOUNTER — Ambulatory Visit: Payer: BC Managed Care – PPO | Admitting: Cardiology

## 2020-08-27 ENCOUNTER — Other Ambulatory Visit: Payer: Self-pay | Admitting: Medical

## 2020-08-27 ENCOUNTER — Encounter: Payer: Self-pay | Admitting: Cardiology

## 2020-08-27 ENCOUNTER — Other Ambulatory Visit: Payer: Self-pay

## 2020-08-27 VITALS — BP 148/98 | HR 73 | Ht 66.0 in | Wt 215.0 lb

## 2020-08-27 DIAGNOSIS — R0683 Snoring: Secondary | ICD-10-CM | POA: Insufficient documentation

## 2020-08-27 DIAGNOSIS — I10 Essential (primary) hypertension: Secondary | ICD-10-CM

## 2020-08-27 DIAGNOSIS — E782 Mixed hyperlipidemia: Secondary | ICD-10-CM

## 2020-08-27 MED ORDER — CARVEDILOL 6.25 MG PO TABS: 6.2500 mg | ORAL_TABLET | Freq: Two times a day (BID) | ORAL | 3 refills | Status: DC

## 2020-08-27 NOTE — Progress Notes (Signed)
Patient referred by Carlena Hurl, PA-C for hypertension  Subjective:   [name], male    DOB: Jan 19, 1971, 49 y.o.   MRN: [id number]   Chief Complaint  Patient presents with  . Hyperlipidemia  . Diabetes Mellitus  . Hypertension  . New Patient (Initial Visit)     HPI  49 y.o. 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 He  Walks 20-40 min 3 times/day without chest pain, shortness of breath, palpitations, leg edema, orthopnea, PND, TIA/syncope. His blood pressure is elevated. He reports snoring at night. Also admits to xcess salt intake at times. He reports heart rate around 100 npm, without any symptoms.  External records reviewed and discussed with the patient.     Past Medical History:  Diagnosis Date  . Diabetes mellitus without complication ([place])   . High cholesterol   . Hypertension      Past Surgical History:  Procedure Laterality Date  . BACK SURGERY    . SHOULDER OPEN ROTATOR CUFF REPAIR       Social History   Tobacco Use  Smoking Status Never Smoker  Smokeless Tobacco Never Used    Social History   Substance and Sexual Activity  Alcohol Use Never     Family History  Problem Relation Age of Onset  . Alzheimer's disease Mother   . Stroke Mother   . Stroke Father   . Heart disease Father 59       died of MI  . Hypertension Father   . Hyperlipidemia Father   . Diabetes Father   . Diabetes Sister   . Diabetes Brother   . Cancer Neg Hx      Current Outpatient Medications on File Prior to Visit  Medication Sig Dispense Refill  . amLODipine (NORVASC) 10 MG tablet Take 1 tablet (10 mg total) by mouth daily. 90 tablet 3  . buPROPion (WELLBUTRIN XL) 150 MG 24 hr tablet Take 1 tablet (150 mg total) by mouth daily. 30 tablet 1  . dapagliflozin propanediol (FARXIGA) 10 MG TABS tablet Take 1 tablet (10 mg total) by mouth daily before breakfast. 90  tablet 0  . fluticasone (FLONASE) 50 MCG/ACT nasal spray INSTILL 2 SPRAY(S) EVERY DAY BY INTRANASAL ROUTE AS DIRECTED FOR 10 DAYS.    Marland Kitchen JANUVIA 100 MG tablet Take 1 tablet (100 mg total) by mouth daily. 90 tablet 3  . metFORMIN (GLUCOPHAGE) 1000 MG tablet Take 1 tablet (1,000 mg total) by mouth 2 (two) times daily with a meal. 180 tablet 3  . Multiple Vitamins-Minerals (MULTIVITAMIN WITH MINERALS) tablet Take 1 tablet by mouth daily.    . rosuvastatin (CRESTOR) 5 MG tablet Take 5 mg by mouth daily.    Marland Kitchen terbinafine (LAMISIL AT) 1 % cream Apply 1 application topically 2 (two) times daily. 30 g 0  . valsartan-hydrochlorothiazide (DIOVAN-HCT) 320-12.5 MG tablet TAKE 1 TABLET BY MOUTH EVERY DAY 90 tablet 0   No current facility-administered medications on file prior to visit.    Cardiovascular and other pertinent studies:  EKG 08/27/2020: Sinus rhythm 69 bpm Incomplete right bundle branch block  Left anterior fascicular block   Recent labs: 08/05/2020: Glucose 112, BUN/Cr 13/0.79. EGFR normal. Na/K 141/3.5.  H/H 13/43. MCV 77. Platelets 259 HbA1C 6.6% Chol 146, TG 120, HDL 42, LDL 82    Review of Systems  Cardiovascular: Negative for chest pain, dyspnea on exertion, leg swelling, palpitations and  syncope.         Vitals:   08/27/20 1213 08/27/20 1219  BP: (!) 153/93 (!) 148/98  Pulse: 71 73  SpO2: 97% 99%     Body mass index is 34.7 kg/m. Filed Weights   08/27/20 1213  Weight: 215 lb (97.5 kg)     Objective:   Physical Exam Vitals and nursing note reviewed.  Constitutional:      General: He is not in acute distress. Neck:     Vascular: No JVD.  Cardiovascular:     Rate and Rhythm: Normal rate and regular rhythm.     Heart sounds: Normal heart sounds. No murmur heard.   Pulmonary:     Effort: Pulmonary effort is normal.     Breath sounds: Normal breath sounds. No wheezing or rales.            Assessment & Recommendations:   49 y.o. Hispanic male  with hypertension, hyperlipidemia, type 2 DM  Hypertension: Uncontrolled. Added carveilol 6.25 mg bid. Should help HR too. Low suspicion for Afib Discussed low salt diet. Referred to sleep study.  Hyperlipidemia: Continue rosuvastatin.   Thank you for referring the patient to [place]. Please feel free to contact with any questions.   Nigel Mormon, MD Pager: [phone] Office: [phone]

## 2020-08-30 ENCOUNTER — Other Ambulatory Visit: Payer: Self-pay

## 2020-08-30 ENCOUNTER — Ambulatory Visit: Payer: BC Managed Care – PPO

## 2020-08-30 DIAGNOSIS — I10 Essential (primary) hypertension: Secondary | ICD-10-CM

## 2020-09-01 NOTE — Progress Notes (Signed)
Patient didn't answer left a vm will try again later

## 2020-09-24 ENCOUNTER — Ambulatory Visit: Payer: BC Managed Care – PPO | Admitting: Cardiology

## 2020-09-24 ENCOUNTER — Encounter: Payer: Self-pay | Admitting: Cardiology

## 2020-09-24 VITALS — BP 129/91 | HR 67 | Resp 16 | Ht 66.0 in | Wt 221.0 lb

## 2020-09-24 DIAGNOSIS — I10 Essential (primary) hypertension: Secondary | ICD-10-CM

## 2020-09-24 DIAGNOSIS — E782 Mixed hyperlipidemia: Secondary | ICD-10-CM

## 2020-09-24 NOTE — Progress Notes (Signed)
Patient referred by Carlena Hurl, PA-C for hypertension  Subjective:   [name], male    DOB: [birth year], 49 y.o.   MRN: [id number]   Chief Complaint  Patient presents with  . Hypertension  . Hyperlipidemia  . Follow-up    4 week     HPI  49 y.o. Hispanic male with hypertension, hyperlipidemia, type 2 DM  Blood pressure is better controlled.   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 He  Walks 20-40 min 3 times/day without chest pain, shortness of breath, palpitations, leg edema, orthopnea, PND, TIA/syncope. His blood pressure is elevated. He reports snoring at night. Also admits to xcess salt intake at times. He reports heart rate around 100 npm, without any symptoms.  External records reviewed and discussed with the patient.     Current Outpatient Medications on File Prior to Visit  Medication Sig Dispense Refill  . amLODipine (NORVASC) 10 MG tablet Take 1 tablet (10 mg total) by mouth daily. 90 tablet 3  . buPROPion (WELLBUTRIN XL) 150 MG 24 hr tablet TAKE 1 TABLET BY MOUTH EVERY DAY 90 tablet 0  . carvedilol (COREG) 6.25 MG tablet Take 1 tablet (6.25 mg total) by mouth 2 (two) times daily. 60 tablet 3  . dapagliflozin propanediol (FARXIGA) 10 MG TABS tablet Take 1 tablet (10 mg total) by mouth daily before breakfast. 90 tablet 0  . JANUVIA 100 MG tablet Take 1 tablet (100 mg total) by mouth daily. 90 tablet 3  . metFORMIN (GLUCOPHAGE) 1000 MG tablet Take 1 tablet (1,000 mg total) by mouth 2 (two) times daily with a meal. 180 tablet 3  . Multiple Vitamins-Minerals (MULTIVITAMIN WITH MINERALS) tablet Take 1 tablet by mouth daily.    . rosuvastatin (CRESTOR) 5 MG tablet Take 5 mg by mouth daily.    Marland Kitchen terbinafine (LAMISIL AT) 1 % cream Apply 1 application topically 2 (two) times daily. 30 g 0  . valsartan-hydrochlorothiazide (DIOVAN-HCT) 320-12.5 MG tablet TAKE 1 TABLET BY MOUTH EVERY DAY 90 tablet 0    No current facility-administered medications on file prior to visit.    Cardiovascular and other pertinent studies:  Echocardiogram 08/30/2020:  Left ventricle cavity is normal in size. Moderate concentric hypertrophy  of the left ventricle. Normal global wall motion. Normal LV systolic  function with EF 55%. Doppler evidence of grade I (impaired) diastolic  dysfunction, normal LAP.  Left atrial cavity is mildly dilated.  No significant valvular abnormality.  Estimated RA pressure 8 mmHg.  EKG 08/27/2020: Sinus rhythm 69 bpm Incomplete right bundle branch block  Left anterior fascicular block   Recent labs: 08/05/2020: Glucose 112, BUN/Cr 13/0.79. EGFR normal. Na/K 141/3.5.  H/H 13/43. MCV 77. Platelets 259 HbA1C 6.6% Chol 146, TG 120, HDL 42, LDL 82    Review of Systems  Cardiovascular: Negative for chest pain, dyspnea on exertion, leg swelling, palpitations and syncope.         Vitals:   09/24/20 1509  BP: (!) 129/91  Pulse: 67  Resp: 16  SpO2: 96%     Body mass index is 35.67 kg/m. Filed Weights   09/24/20 1509  Weight: 221 lb (100.2 kg)     Objective:   Physical Exam Vitals and nursing note reviewed.  Constitutional:      General: He is not in acute distress. Neck:     Vascular: No JVD.  Cardiovascular:     Rate and Rhythm: Normal rate  and regular rhythm.     Heart sounds: Normal heart sounds. No murmur heard.   Pulmonary:     Effort: Pulmonary effort is normal.     Breath sounds: Normal breath sounds. No wheezing or rales.            Assessment & Recommendations:   49 y.o. Hispanic male with hypertension, hyperlipidemia, type 2 DM  Hypertension: Better controlled with addition of carveilol 6.25 mg bid. This can be up titrated to up to 25 mg bid, as necessary. Continue rest of the antihypertensive therapy.   Hyperlipidemia: Continue rosuvastatin.  I will see him on as needed basis.   Nigel Mormon, MD Pager:  [phone] Office: [phone]

## 2020-09-29 ENCOUNTER — Encounter: Payer: Self-pay | Admitting: Neurology

## 2020-09-29 ENCOUNTER — Institutional Professional Consult (permissible substitution): Payer: BC Managed Care – PPO | Admitting: Neurology

## 2020-10-20 ENCOUNTER — Other Ambulatory Visit: Payer: Self-pay | Admitting: Medical

## 2020-11-04 ENCOUNTER — Other Ambulatory Visit: Payer: Self-pay | Admitting: Medical

## 2020-11-23 ENCOUNTER — Other Ambulatory Visit: Payer: Self-pay | Admitting: Medical

## 2020-11-24 ENCOUNTER — Other Ambulatory Visit: Payer: Self-pay | Admitting: Cardiology

## 2020-11-24 DIAGNOSIS — I10 Essential (primary) hypertension: Secondary | ICD-10-CM

## 2020-12-14 ENCOUNTER — Other Ambulatory Visit: Payer: Self-pay | Admitting: Medical

## 2020-12-15 NOTE — Telephone Encounter (Signed)
Refill and get in for 75mo f/u

## 2020-12-15 NOTE — Telephone Encounter (Signed)
Appointment has been scheduled.

## 2020-12-22 ENCOUNTER — Encounter: Payer: Self-pay | Admitting: Medical

## 2020-12-22 ENCOUNTER — Other Ambulatory Visit: Payer: Self-pay

## 2020-12-22 ENCOUNTER — Ambulatory Visit: Payer: BC Managed Care – PPO | Admitting: Medical

## 2020-12-22 VITALS — BP 134/86 | HR 61 | Ht 66.0 in | Wt 222.4 lb

## 2020-12-22 DIAGNOSIS — I10 Essential (primary) hypertension: Secondary | ICD-10-CM

## 2020-12-22 DIAGNOSIS — R300 Dysuria: Secondary | ICD-10-CM

## 2020-12-22 DIAGNOSIS — E1169 Type 2 diabetes mellitus with other specified complication: Secondary | ICD-10-CM

## 2020-12-22 DIAGNOSIS — H538 Other visual disturbances: Secondary | ICD-10-CM

## 2020-12-22 DIAGNOSIS — R3589 Other polyuria: Secondary | ICD-10-CM

## 2020-12-22 DIAGNOSIS — E118 Type 2 diabetes mellitus with unspecified complications: Secondary | ICD-10-CM | POA: Diagnosis not present

## 2020-12-22 DIAGNOSIS — E782 Mixed hyperlipidemia: Secondary | ICD-10-CM

## 2020-12-22 DIAGNOSIS — F320 Major depressive disorder, single episode, mild: Secondary | ICD-10-CM

## 2020-12-22 DIAGNOSIS — Z7185 Encounter for immunization safety counseling: Secondary | ICD-10-CM

## 2020-12-22 DIAGNOSIS — IMO0002 Reserved for concepts with insufficient information to code with codable children: Secondary | ICD-10-CM

## 2020-12-22 DIAGNOSIS — E785 Hyperlipidemia, unspecified: Secondary | ICD-10-CM

## 2020-12-22 DIAGNOSIS — E1165 Type 2 diabetes mellitus with hyperglycemia: Secondary | ICD-10-CM

## 2020-12-22 LAB — POCT URINALYSIS DIP (PROADVANTAGE DEVICE)
Bilirubin, UA: NEGATIVE
Blood, UA: NEGATIVE
Glucose, UA: >=1000 mg/dL — AB
Ketones, POC UA: NEGATIVE mg/dL
Leukocytes, UA: NEGATIVE
Nitrite, UA: NEGATIVE
Protein Ur, POC: NEGATIVE mg/dL
Specific Gravity, Urine: 1.015
Urobilinogen, Ur: 0.2
pH, UA: 6.5 (ref 5.0–8.0)

## 2020-12-22 LAB — POCT GLYCOSYLATED HEMOGLOBIN (HGB A1C): Hemoglobin A1C: 7.3 % — AB (ref 4.0–5.6)

## 2020-12-22 NOTE — Progress Notes (Signed)
Subjective:  [name] is a 50 y.o. male who presents for Chief Complaint  Patient presents with  . Medication Management     Here for recheck on diabetes  He notes some recent polyuria, urgency, burning with urination x 2- 3 weeks.   No fever.  No belly or back pain.  No prior UTI.   Married.  No concern for STD.  Diabetes - hasn't been check sugars lately.  Meter is broken.  He is compliant with Metformin at 1000 mg twice a day, Januvia 100 mg daily, Farxiga 10 mg daily.  No foot lesions.  He does get occasional blurred vision.  Has not seen an eye doctor in a while.  Since last visit he saw the heart doctor and Coreg was added.  He is compliant with amlodipine 10 mg daily, valsartan HCT 320/12.5 mg daily, Coreg twice a day  His mother ended up passing away in December.  He feels like the Wellbutrin has been helping his mood.  He came to peace with things.  Hyperlipidemia-compliant with statin  no other aggravating or relieving factors.    No other c/o.  Past Medical History:  Diagnosis Date  . Diabetes mellitus without complication (HCC)   . High cholesterol    Current Outpatient Medications on File Prior to Visit  Medication Sig Dispense Refill  . amLODipine (NORVASC) 10 MG tablet Take 1 tablet (10 mg total) by mouth daily. 90 tablet 3  . buPROPion (WELLBUTRIN XL) 150 MG 24 hr tablet TAKE 1 TABLET BY MOUTH EVERY DAY 90 tablet 0  . carvedilol (COREG) 6.25 MG tablet TAKE 1 TABLET BY MOUTH TWICE A DAY 180 tablet 1  . FARXIGA 10 MG TABS tablet TAKE 1 TABLET (10 MG TOTAL) BY MOUTH DAILY BEFORE BREAKFAST. 90 tablet 0  . JANUVIA 100 MG tablet Take 1 tablet (100 mg total) by mouth daily. 90 tablet 3  . metFORMIN (GLUCOPHAGE) 1000 MG tablet Take 1 tablet (1,000 mg total) by mouth 2 (two) times daily with a meal. 180 tablet 3  . Multiple Vitamins-Minerals (MULTIVITAMIN WITH MINERALS) tablet Take 1 tablet by mouth daily.    . rosuvastatin (CRESTOR) 5 MG tablet Take 5 mg by  mouth daily.    . valsartan-hydrochlorothiazide (DIOVAN-HCT) 320-12.5 MG tablet TAKE 1 TABLET BY MOUTH EVERY DAY 90 tablet 0  . terbinafine (LAMISIL AT) 1 % cream Apply 1 application topically 2 (two) times daily. (Patient not taking: Reported on 12/22/2020) 30 g 0   No current facility-administered medications on file prior to visit.     The following portions of the patient's history were reviewed and updated as appropriate: allergies, current medications, past family history, past medical history, past social history, past surgical history and problem list.  ROS Otherwise as in subjective above    Objective: BP 134/86   Pulse 61   Ht 5\' 6"  (1.676 m)   Wt 222 lb 6.4 oz (100.9 kg)   SpO2 96%   BMI 35.90 kg/m   BP Readings from Last 3 Encounters:  12/22/20 134/86  09/24/20 (!) 129/91  08/27/20 (!) 148/98   General appearance: alert, no distress, well developed, well nourished Neck: supple, no lymphadenopathy, no thyromegaly, no masses, no bruits Heart: RRR, normal S1, S2, no murmurs Lungs: CTA bilaterally, no wheezes, rhonchi, or rales Abdomen: +bs, soft, non tender, non distended, no masses, no hepatomegaly, no splenomegaly Back:  nontender Pulses: 2+ radial pulses, 2+ pedal pulses, normal cap refill Ext: no edema  Diabetic Foot Exam - Simple   Simple Foot Form Diabetic Foot exam was performed with the following findings: Yes 12/22/2020  2:48 PM  Visual Inspection See comments: Yes Sensation Testing Intact to touch and monofilament testing bilaterally: Yes Pulse Check Posterior Tibialis and Dorsalis pulse intact bilaterally: Yes Comments Slight thickening of great toenails bilat, otherwise no lesions      Assessment: Encounter Diagnoses  Name Primary?  . Polyuria Yes  . Blurred vision   . Dysuria   . Primary hypertension   . Hyperlipidemia associated with type 2 diabetes mellitus (HCC)   . Diabetes mellitus type 2 with complications, uncontrolled (HCC)   .  Depression, major, single episode, mild (HCC)   . Mixed hyperlipidemia   . Vaccine counseling      Plan: Polyuria, dysuria-send urine for culture.  I suppose [name] could be causing some problems  Blurred vision, diabetes-advised to see eye doctor soon  Diabetes-hemoglobin A1c lab today.  Prescription given for freestyle libre meter device.  Daily foot checks advised, yearly eye doctor visit advised, continue current medications  Hypertension-continue current medication.  I reviewed the recent cardiology notes from Dr. Rosemary Holms  Hyperlipidemia-continue statin  Reviewed labs from September 2021  Depression-improved on Wellbutrin.  Glad to hear things are better, expressed sympathy for the loss of his mother in December   Vaccines: You are up to date on tetanus booster You are up to date on flu shot You have had the initial 2 covid vaccines   Bryne was seen today for medication management.  Diagnoses and all orders for this visit:  Polyuria -     POCT Urinalysis DIP (Proadvantage Device) -     Urine Culture  Blurred vision  Dysuria -     POCT Urinalysis DIP (Proadvantage Device) -     Urine Culture  Primary hypertension  Hyperlipidemia associated with type 2 diabetes mellitus (HCC)  Diabetes mellitus type 2 with complications, uncontrolled (HCC) -     Cancel: Hemoglobin A1c -     HgB A1c  Depression, major, single episode, mild (HCC)  Mixed hyperlipidemia  Vaccine counseling    Follow up: 80mo

## 2020-12-22 NOTE — Patient Instructions (Signed)
See eye doctor soon  EYE DOCTOR RECOMMENDATIONS  [facility] [address] [place], [place] [address] Phone: [phone] [email or web address]   [facility] Dr. Gelene Mink [address], [place] [place], [place] [address]  [phone] [email or web address]   Vincenza Hews, M.D. Susanne Greenhouse, O.D. [address] [place], [place] [address] Medical telephone: [phone] Optical telephone: [phone]   Dr. Glenford Peers [address] [place], [place] [address] [phone]     Type 2 Diabetes  Diabetes is a long-lasting (chronic) disease.  With diabetes, either the pancreas does not make enough of a hormone called insulin, or the body has trouble using the insulin that is made.  Over time, diabetes can damage the eyes, kidneys, and nerves causing retinopathy, nephropathy, and neuropathy.  Diabetes puts you at risk for heart disease and peripheral vascular disease which can lead to heart attack, stroke, foot ulcers, and amputations.    Our goal and hopefully your goal is to manage your diabetes in such a way to slow the progression of the disease and do all we can to keep you healthy  Home Care:   Eat healthy, exercise regularly, limit alcohol, and don't smoke!  Check your blood sugar (glucose) once a day before breakfast, or as indicated by our discussion today.  The goal is to keep your morning fasting sugar less than 130.  In general you should try to keep your blood sugars between 80 - 130 fasting or before meals.  We also want to avoid hypoglycemia or low blood sugars less than 70.  Make sure you understand how to treat hypoglycemic episodes.  Ask me about this if we had not discussed this.  If you are on medication, take your medications daily, don't run out of medications.  Learn about low blood sugar (hypoglycemia). Know how to treat it.  Wear a necklace or bracelet that says you have  diabetes in the event of emergency for first responders to identify you have diabetes.  Check your feet every night for cuts, sores, blisters, and redness. Tell your medical provider if you have problems.  Maintain a normal body weight, or normal BMI - height to weight ratio of 20-25.  Ask me about this.  GET HELP RIGHT AWAY IF:  You have trouble keeping your blood sugar in target range.  You have problems with your medicines.  You are sick and not getting better after 24 hours.  You have a sore or wound that is not healing.  You have vision problems or changes.  You have a fever.   Exercise regularly since it has beneficial effects on the heart and blood sugars. Exercising at least three times per week or 150 minutes per week can be as important as medication to a diabetic.  Find some form of exercise that you will enjoy doing regularly.  This can include walking, biking, kayaking, golfing, swimming, dance, aerobics, hiking, etc.  If you have joint problems, many local gyms have equipment to accommodate people with specific needs.    Vaccinations:  Diabetics are at increased risk for infection, and illnesses can take longer to resolve.  Current vaccine recommendations include yearly Influenza (flu) vaccine (recommended in October), Pneumococcal vaccine, Hepatitis B vaccine series, Tdap (tetanus, diptheria and pertussis) vaccine every 10 years, Covid vaccination, and other age appropriate vaccinations.     Office visits:  We recommend routine medical care to make sure we are addressing prevention and issues  as they arise.  Typically this could mean twice yearly or up to quarterly depending upon your unique health situation.  Exams should include a yearly physical, a yearly foot exam, and other examination as appropriate.  You should see an eye doctor yearly to help screen for and prevent blood vessel complications in your eyes.  Labs: Diabetics should have blood work done at least twice  yearly to monitor your Hemoglobin A1C (a three-month average of your blood sugars) and your cholesterol.  You should have your urine and blood checked yearly to screen for kidney damage.  This may include creatinine and micro-albumin levels.  Other labs as appropriate.    Blood pressure goals:  Goal blood pressure in diabetics should be 130/80 or less. Monitoring your blood pressure with a home blood pressure cuff of your own is an excellent idea.  If you are prescribed medication for blood pressure, take your medication every day, and don't run out of medication.  Having high blood pressure can damage your heart, eyes, kidneys, and put you at risk for heart attack and stroke.  Tobacco use:  If you smoke, dip or chew, quitting will reduce your risk of heart attack, stroke, peripheral vascular disease, and many cancers.  Other Specialists:   Eye doctor: See your eye doctor yearly for routine eye exam and diabetic eye exam.  Make sure your eye doctor sends [place] a copy of the report  Dentist: See your dentist twice yearly for dental hygiene visits and routine dental care.  Brush and floss your teeth every day.  Dental hygiene is and important part of diabetic care.  Diabetics sometimes see other specialist depending on the complexity of your health issues.    Diabetic Report Card for [facility]  December 22, 2020  Below is a summary of recent tests related to your diabetes that can help you manage your health.   Hemoglobin A1C:  Your Hemoglobin A1C values should be less than 7. If these are greater than 7, you have a higher chance of having eye, heart, and kidney problems in the future.   Your most recent Hemoglobin A1C values were:  Hgb A1c MFr Bld (%)  Date Value  08/05/2020 6.6 (H)  02/18/2020 9.1 (H)     Cholesterol:  Your LDL Cholesterol (bad cholesterol) values should be less than 100 mg/dL if you do not have cardiovascular disease.  The LDL should be less than 70 mg/dL if you  do have cardiovascular disease.  If your LDL is consistently higher than 100 mg/dL, then your risk of heart attack and stroke increases yearly.   Your most recent LDL Cholesterol (bad cholesterol) results were:  LDL Chol Calc (NIH) (mg/dL)  Date Value  80/99/8338 82     Your HDL Cholesterol (good cholesterol) values should be higher than 40 mg/dL.  If your HDL is lower than 40 mg/dL, this increases your risk of heart attack and stroke.   Your most recent HDL Cholesterol (bad cholesterol) results were:  No components found for: HDLCALC    Blood Pressure:  Your blood pressure values should be less than 130/80. Please contact me if your readings at home are consistently higher than this.   Your most recent blood pressure readings at our clinic were:  BP Readings from Last 3 Encounters:  12/22/20 (!) 148/80  09/24/20 (!) 129/91  08/27/20 (!) 148/98    Urine Protein: Having an elevated microalbumin to creatinine ratio is a marker for early kidney damage due  to diabetes or high blood pressure.

## 2020-12-23 LAB — URINE CULTURE

## 2021-01-18 ENCOUNTER — Other Ambulatory Visit: Payer: Self-pay | Admitting: Medical

## 2021-02-24 ENCOUNTER — Other Ambulatory Visit: Payer: Self-pay | Admitting: Medical

## 2021-03-06 ENCOUNTER — Other Ambulatory Visit: Payer: Self-pay | Admitting: Medical

## 2021-04-16 ENCOUNTER — Other Ambulatory Visit: Payer: Self-pay | Admitting: Medical

## 2021-05-22 ENCOUNTER — Other Ambulatory Visit: Payer: Self-pay | Admitting: Cardiology

## 2021-05-22 DIAGNOSIS — I10 Essential (primary) hypertension: Secondary | ICD-10-CM

## 2021-06-01 ENCOUNTER — Other Ambulatory Visit: Payer: Self-pay | Admitting: Medical

## 2021-06-12 ENCOUNTER — Other Ambulatory Visit: Payer: Self-pay | Admitting: Medical

## 2021-08-04 ENCOUNTER — Other Ambulatory Visit: Payer: Self-pay | Admitting: Medical

## 2021-08-04 NOTE — Telephone Encounter (Signed)
Has upcoming appointment

## 2021-08-09 ENCOUNTER — Other Ambulatory Visit: Payer: Self-pay

## 2021-08-09 ENCOUNTER — Encounter: Payer: Self-pay | Admitting: Medical

## 2021-08-09 ENCOUNTER — Ambulatory Visit: Payer: BC Managed Care – PPO | Admitting: Medical

## 2021-08-09 VITALS — BP 130/88 | HR 62 | Ht 66.0 in | Wt 223.4 lb

## 2021-08-09 DIAGNOSIS — I10 Essential (primary) hypertension: Secondary | ICD-10-CM

## 2021-08-09 DIAGNOSIS — Z1211 Encounter for screening for malignant neoplasm of colon: Secondary | ICD-10-CM

## 2021-08-09 DIAGNOSIS — E782 Mixed hyperlipidemia: Secondary | ICD-10-CM

## 2021-08-09 DIAGNOSIS — F324 Major depressive disorder, single episode, in partial remission: Secondary | ICD-10-CM | POA: Insufficient documentation

## 2021-08-09 DIAGNOSIS — N529 Male erectile dysfunction, unspecified: Secondary | ICD-10-CM

## 2021-08-09 DIAGNOSIS — L729 Follicular cyst of the skin and subcutaneous tissue, unspecified: Secondary | ICD-10-CM | POA: Insufficient documentation

## 2021-08-09 DIAGNOSIS — Z23 Encounter for immunization: Secondary | ICD-10-CM | POA: Diagnosis not present

## 2021-08-09 DIAGNOSIS — Z7185 Encounter for immunization safety counseling: Secondary | ICD-10-CM

## 2021-08-09 DIAGNOSIS — IMO0002 Reserved for concepts with insufficient information to code with codable children: Secondary | ICD-10-CM

## 2021-08-09 DIAGNOSIS — Z125 Encounter for screening for malignant neoplasm of prostate: Secondary | ICD-10-CM | POA: Diagnosis not present

## 2021-08-09 DIAGNOSIS — E1165 Type 2 diabetes mellitus with hyperglycemia: Secondary | ICD-10-CM

## 2021-08-09 DIAGNOSIS — B351 Tinea unguium: Secondary | ICD-10-CM | POA: Insufficient documentation

## 2021-08-09 DIAGNOSIS — E118 Type 2 diabetes mellitus with unspecified complications: Secondary | ICD-10-CM

## 2021-08-09 DIAGNOSIS — Z0000 Encounter for general adult medical examination without abnormal findings: Secondary | ICD-10-CM

## 2021-08-09 DIAGNOSIS — E1169 Type 2 diabetes mellitus with other specified complication: Secondary | ICD-10-CM

## 2021-08-09 DIAGNOSIS — E785 Hyperlipidemia, unspecified: Secondary | ICD-10-CM

## 2021-08-09 DIAGNOSIS — Z6836 Body mass index (BMI) 36.0-36.9, adult: Secondary | ICD-10-CM

## 2021-08-09 DIAGNOSIS — E1140 Type 2 diabetes mellitus with diabetic neuropathy, unspecified: Secondary | ICD-10-CM | POA: Insufficient documentation

## 2021-08-09 NOTE — Progress Notes (Signed)
Subjective:   HPI  [name] is a 50 y.o. male who presents for Chief Complaint  Patient presents with   fasting cpe    Fasting cpe, has a bump on back. Flu shot given today    Patient Care Team: Dasan Hardman, Kermit Balo, PA-C as PCP - General (Family Medicine) Allergist  Dr. Truett Mainland, cardiology    Concerns: Lately doing fine.  No particular complaints other than a cyst on his back.  Itchy sometimes but not painful or swollen  He still goes to [place] every now and then to take his mom to the doctor.  She has Alzheimer's.  Hypertension- compliant with Amlodipine 10mg  daily, carvedilol 6.25 mg twice daily valsartan HCT 320/12.5mg  daily  Diabetes - compliant with Januvia 100mg  daily, Metfomrin 1000mg  BID.  Checks blood sugars.  Morning sugars tend to be at goal.  He does get burning sensations in his feet and numbness every now and then.  hyperlipemia - compliant with Crestor 5mg  daily   Reviewed their medical, surgical, family, social, medication, and allergy history and updated chart as appropriate.  Past Medical History:  Diagnosis Date   Diabetes mellitus without complication (HCC)    High cholesterol    Hypertension     Past Surgical History:  Procedure Laterality Date   BACK SURGERY     SHOULDER OPEN ROTATOR CUFF REPAIR      Family History  Problem Relation Age of Onset   Alzheimer's disease Mother    Stroke Mother    Stroke Father    Heart disease Father 47       died of MI   Hypertension Father    Hyperlipidemia Father    Diabetes Father    Diabetes Sister    Diabetes Brother    Cancer Neg Hx      Current Outpatient Medications:    amLODipine (NORVASC) 10 MG tablet, Take 1 tablet (10 mg total) by mouth daily., Disp: 90 tablet, Rfl: 3   buPROPion (WELLBUTRIN XL) 150 MG 24 hr tablet, TAKE 1 TABLET BY MOUTH EVERY DAY, Disp: 90 tablet, Rfl: 0   carvedilol (COREG) 6.25 MG tablet, TAKE 1 TABLET BY MOUTH TWICE A DAY, Disp: 180 tablet, Rfl:  1   JANUVIA 100 MG tablet, TAKE 1 TABLET BY MOUTH EVERY DAY, Disp: 30 tablet, Rfl: 0   metFORMIN (GLUCOPHAGE) 1000 MG tablet, TAKE 1 TABLET BY MOUTH 2 TIMES DAILY WITH A MEAL., Disp: 180 tablet, Rfl: 0   Multiple Vitamins-Minerals (MULTIVITAMIN WITH MINERALS) tablet, Take 1 tablet by mouth daily., Disp: , Rfl:    rosuvastatin (CRESTOR) 5 MG tablet, TAKE 1 TABLET BY MOUTH EVERYDAY AT BEDTIME, Disp: 90 tablet, Rfl: 3   valsartan-hydrochlorothiazide (DIOVAN-HCT) 320-12.5 MG tablet, TAKE 1 TABLET BY MOUTH EVERY DAY, Disp: 90 tablet, Rfl: 0  Allergies  Allergen Reactions   Morphine And Related Other (See Comments)    CAUSES SEVERE PRESSURE (FEELING) IN HIS HEAD       Review of Systems Constitutional: -fever, -chills, -sweats, -unexpected weight change, -decreased appetite, -fatigue Allergy: -sneezing, -itching, -congestion Dermatology: -changing moles, --rash, -lumps ENT: -runny nose, -ear pain, -sore throat, -hoarseness, -sinus pain, -teeth pain, - ringing in ears, -hearing loss, -nosebleeds Cardiology: -chest pain, -palpitations, -swelling, -difficulty breathing when lying flat, -waking up short of breath Respiratory: -cough, -shortness of breath, -difficulty breathing with exercise or exertion, -wheezing, -coughing up blood Gastroenterology: -abdominal pain, -nausea, -vomiting, -diarrhea, -constipation, -blood in stool, -changes in bowel movement, -difficulty swallowing or eating Hematology: -  bleeding, -bruising  Musculoskeletal: -joint aches, -muscle aches, -joint swelling, -back pain, -neck pain, -cramping, -changes in gait Ophthalmology: denies vision changes, eye redness, itching, discharge Urology: -burning with urination, -difficulty urinating, -blood in urine, -urinary frequency, -urgency, -incontinence Neurology: -headache, -weakness, -tingling, -numbness, -memory loss, -falls, -dizziness Psychology: -depressed mood, -agitation, -sleep problems Male GU: no testicular mass, pain,  no lymph nodes swollen, no swelling, no rash.     Objective:  BP 130/88   Pulse 62   Ht 5\' 6"  (1.676 m)   Wt 223 lb 6.4 oz (101.3 kg)   BMI 36.06 kg/m    Wt Readings from Last 3 Encounters:  08/09/21 223 lb 6.4 oz (101.3 kg)  12/22/20 222 lb 6.4 oz (100.9 kg)  09/24/20 221 lb (100.2 kg)   BP Readings from Last 3 Encounters:  08/09/21 130/88  12/22/20 134/86  09/24/20 (!) 129/91    General appearance: alert, no distress, WD/WN, Hispanic male Skin: Several skin tags around the neck, on the upper chest, right lateral orbit, pinkish-brown irritated rash along inguinal areas of groin and under pannus of abdomen, suggestive of fungus, no other worrisome lesions HEENT/oral -deferred due to Covid precaution Neck: supple, no lymphadenopathy, no thyromegaly, no masses, normal ROM, no bruits Chest: non tender, normal shape and expansion Heart: RRR, normal S1, S2, no murmurs Lungs: CTA bilaterally, no wheezes, rhonchi, or rales Abdomen: +bs, soft, non tender, non distended, no masses, no hepatomegaly, no splenomegaly, no bruits Back: Lumbar spine surgical scar, non tender, normal ROM, no scoliosis Musculoskeletal: upper extremities non tender, no obvious deformity, normal ROM throughout, lower extremities non tender, no obvious deformity, normal ROM throughout Extremities: no edema, no cyanosis, no clubbing Pulses: 2+ symmetric, upper and lower extremities, normal cap refill Neurological: alert, oriented x 3, CN2-12 intact, strength normal upper extremities and lower extremities, sensation normal throughout, DTRs 2+ throughout, no cerebellar signs, gait normal Psychiatric: normal affect, behavior normal, pleasant  GU: normal male external genitalia,uncircumcised, nontender, no masses, no hernia, no lymphadenopathy Rectal: Anus normal tone, prostate within normal limits  Diabetic Foot Exam - Simple   Simple Foot Form Diabetic Foot exam was performed with the following findings: Yes  08/09/2021 11:52 AM  Visual Inspection See comments: Yes Sensation Testing See comments: Yes Pulse Check Posterior Tibialis and Dorsalis pulse intact bilaterally: Yes Comments 2+ pedal pulses, sensation monofilament normal except for small toes bilaterally, thickened discolored toenails of the great toes       Assessment and Plan :   Encounter Diagnoses  Name Primary?   Encounter for health maintenance examination in adult Yes   Need for pneumococcal vaccination    Need for influenza vaccination    Screen for colon cancer    Screening for prostate cancer    Mixed hyperlipidemia    Hyperlipidemia associated with type 2 diabetes mellitus (HCC)    Vaccine counseling    Onychomycosis    Diabetes mellitus type 2 with complications, uncontrolled (HCC)    Erectile dysfunction, unspecified erectile dysfunction type    Primary hypertension    Type 2 diabetes mellitus with diabetic neuropathy, unspecified whether long term insulin use (HCC)    Cyst of skin    Depression, major, single episode, in partial remission (HCC)    BMI 36.0-36.9,adult      This visit was a preventative care visit, also known as wellness visit or routine physical.   Topics typically include healthy lifestyle, diet, exercise, preventative care, vaccinations, sick and well care, proper use of emergency  dept and after hours care, as well as other concerns.     Recommendations: Continue to return yearly for your annual wellness and preventative care visits.  This gives [place] a chance to discuss healthy lifestyle, exercise, vaccinations, review your chart record, and perform screenings where appropriate.  I recommend you see your eye doctor yearly for routine vision care.  I recommend you see your dentist yearly for routine dental care including hygiene visits twice yearly.   Vaccination recommendations were reviewed Immunization History  Administered Date(s) Administered   Influenza,inj,Quad PF,6+ Mos  08/05/2020, 08/09/2021   PFIZER(Purple Top)SARS-COV-2 Vaccination 06/18/2020, 11/16/2020   Pneumococcal Polysaccharide-23 08/09/2021   Td 08/05/2020    Counseled on the influenza virus vaccine.  Vaccine information sheet given.  Influenza vaccine given after consent obtained.  Counseled on the pneumococcal vaccine.  Vaccine information sheet given.  Pneumococcal vaccine PPSV23 given after consent obtained.  Shingles vaccine:  I recommend you have a shingles vaccine to help prevent shingles or herpes zoster outbreak.   Please call your insurer to inquire about coverage for the Shingrix vaccine given in 2 doses.   Some insurers cover this vaccine after age 24, some cover this after age 37.  If your insurer covers this, then call to schedule appointment to have this vaccine here.    Screening for cancer: Colon cancer screening: We will refer you for screening colonoscopy  We discussed PSA, prostate exam, and prostate cancer screening risks/benefits.     Skin cancer screening: Check your skin regularly for new changes, growing lesions, or other lesions of concern Come in for evaluation if you have skin lesions of concern.  Lung cancer screening: If you have a greater than 20 pack year history of tobacco use, then you may qualify for lung cancer screening with a chest CT scan.   Please call your insurance company to inquire about coverage for this test.  We currently don't have screenings for other cancers besides breast, cervical, colon, and lung cancers.  If you have a strong family history of cancer or have other cancer screening concerns, please let me know.    Bone health: Get at least 150 minutes of aerobic exercise weekly Get weight bearing exercise at least once weekly Bone density test:  A bone density test is an imaging test that uses a type of X-ray to measure the amount of calcium and other minerals in your bones. The test may be used to diagnose or screen you for a  condition that causes weak or thin bones (osteoporosis), predict your risk for a broken bone (fracture), or determine how well your osteoporosis treatment is working. The bone density test is recommended for females 65 and older, or females or males <65 if certain risk factors such as thyroid disease, long term use of steroids such as for asthma or rheumatological issues, vitamin D deficiency, estrogen deficiency, family history of osteoporosis, self or family history of fragility fracture in first degree relative.    Heart health: Get at least 150 minutes of aerobic exercise weekly Limit alcohol It is important to maintain a healthy blood pressure and healthy cholesterol numbers  Heart disease screening: Screening for heart disease includes screening for blood pressure, fasting lipids, glucose/diabetes screening, BMI height to weight ratio, reviewed of smoking status, physical activity, and diet.    Goals include blood pressure 120/80 or less, maintaining a healthy lipid/cholesterol profile, preventing diabetes or keeping diabetes numbers under good control, not smoking or using tobacco products, exercising most days  per week or at least 150 minutes per week of exercise, and eating healthy variety of fruits and vegetables, healthy oils, and avoiding unhealthy food choices like fried food, fast food, high sugar and high cholesterol foods.     Medical care options: I recommend you continue to seek care here first for routine care.  We try really hard to have available appointments Monday through Friday daytime hours for sick visits, acute visits, and physicals.  Urgent care should be used for after hours and weekends for significant issues that cannot wait till the next day.  The emergency department should be used for significant potentially life-threatening emergencies.  The emergency department is expensive, can often have long wait times for less significant concerns, so try to utilize primary  care, urgent care, or telemedicine when possible to avoid unnecessary trips to the emergency department.  Virtual visits and telemedicine have been introduced since the pandemic started in 2020, and can be convenient ways to receive medical care.  We offer virtual appointments as well to assist you in a variety of options to seek medical care.    Separate significant issues discussed: Updated labs today  Continue current medications, continue daily foot checks, glucose monitoring  Symptoms of neuropathy today and somewhat decreased sensation in lateral toes.  Consider other eval or treatment  Cyst of skin-benign, reassured  Onychomycosis of great toenails-consider Lamisil pending labs  Work on efforts to lose weight through healthy diet and exercise   Mohmed was seen today for fasting cpe.  Diagnoses and all orders for this visit:  Encounter for health maintenance examination in adult -     Hemoglobin A1c -     Comprehensive metabolic panel -     CBC -     Lipid panel -     Microalbumin/Creatinine Ratio, Urine -     PSA  Need for pneumococcal vaccination -     Pneumococcal polysaccharide vaccine 23-valent greater than or equal to 2yo subcutaneous/IM  Need for influenza vaccination -     Flu Vaccine QUAD 15mo+IM (Fluarix, Fluzone & Alfiuria Quad PF)  Screen for colon cancer -     Ambulatory referral to Gastroenterology  Screening for prostate cancer -     PSA  Mixed hyperlipidemia -     Lipid panel  Hyperlipidemia associated with type 2 diabetes mellitus (HCC)  Vaccine counseling  Onychomycosis  Diabetes mellitus type 2 with complications, uncontrolled (HCC)  Erectile dysfunction, unspecified erectile dysfunction type  Primary hypertension  Type 2 diabetes mellitus with diabetic neuropathy, unspecified whether long term insulin use (HCC)  Cyst of skin  Depression, major, single episode, in partial remission (HCC)  BMI 36.0-36.9,adult    Follow-up pending  labs, yearly for physical, q23mo for diabetes

## 2021-08-10 ENCOUNTER — Other Ambulatory Visit: Payer: Self-pay | Admitting: Medical

## 2021-08-10 LAB — COMPREHENSIVE METABOLIC PANEL
ALT: 54 IU/L — ABNORMAL HIGH (ref 0–44)
AST: 36 IU/L (ref 0–40)
Albumin/Globulin Ratio: 2.2 (ref 1.2–2.2)
Albumin: 4.7 g/dL (ref 4.0–5.0)
Alkaline Phosphatase: 86 IU/L (ref 44–121)
BUN/Creatinine Ratio: 13 (ref 9–20)
BUN: 10 mg/dL (ref 6–24)
Bilirubin Total: 0.3 mg/dL (ref 0.0–1.2)
CO2: 23 mmol/L (ref 20–29)
Calcium: 9.5 mg/dL (ref 8.7–10.2)
Chloride: 95 mmol/L — ABNORMAL LOW (ref 96–106)
Creatinine, Ser: 0.8 mg/dL (ref 0.76–1.27)
Globulin, Total: 2.1 g/dL (ref 1.5–4.5)
Glucose: 229 mg/dL — ABNORMAL HIGH (ref 65–99)
Potassium: 3.7 mmol/L (ref 3.5–5.2)
Sodium: 138 mmol/L (ref 134–144)
Total Protein: 6.8 g/dL (ref 6.0–8.5)
eGFR: 108 mL/min/{1.73_m2} (ref >59)

## 2021-08-10 LAB — CBC
Hematocrit: 42.3 % (ref 37.5–51.0)
Hemoglobin: 14 g/dL (ref 13.0–17.7)
MCH: 24.9 pg — ABNORMAL LOW (ref 26.6–33.0)
MCHC: 33.1 g/dL (ref 31.5–35.7)
MCV: 75 fL — ABNORMAL LOW (ref 79–97)
Platelets: 287 10*3/uL (ref 150–450)
RBC: 5.62 x10E6/uL (ref 4.14–5.80)
RDW: 15.2 % (ref 11.6–15.4)
WBC: 8.7 10*3/uL (ref 3.4–10.8)

## 2021-08-10 LAB — LIPID PANEL
Chol/HDL Ratio: 3.9 ratio (ref 0.0–5.0)
Cholesterol, Total: 146 mg/dL (ref 100–199)
HDL: 37 mg/dL — ABNORMAL LOW (ref >39)
LDL Chol Calc (NIH): 72 mg/dL (ref 0–99)
Triglycerides: 226 mg/dL — ABNORMAL HIGH (ref 0–149)
VLDL Cholesterol Cal: 37 mg/dL (ref 5–40)

## 2021-08-10 LAB — MICROALBUMIN / CREATININE URINE RATIO
Creatinine, Urine: 165.5 mg/dL
Microalb/Creat Ratio: 34 mg/g creat — ABNORMAL HIGH (ref 0–29)
Microalbumin, Urine: 56.5 ug/mL

## 2021-08-10 LAB — HEMOGLOBIN A1C
Est. average glucose Bld gHb Est-mCnc: 212 mg/dL
Hgb A1c MFr Bld: 9 % — ABNORMAL HIGH (ref 4.8–5.6)

## 2021-08-10 LAB — PSA: Prostate Specific Ag, Serum: 0.4 ng/mL (ref 0.0–4.0)

## 2021-08-10 MED ORDER — OZEMPIC (0.25 OR 0.5 MG/DOSE) 2 MG/1.5ML ~~LOC~~ SOPN: 0.5000 mg | PEN_INJECTOR | SUBCUTANEOUS | 5 refills | Status: DC

## 2021-08-10 MED ORDER — METFORMIN HCL 1000 MG PO TABS: 1000.0000 mg | ORAL_TABLET | Freq: Two times a day (BID) | ORAL | 1 refills | Status: DC

## 2021-08-10 MED ORDER — BUPROPION HCL ER (XL) 150 MG PO TB24
150.0000 mg | ORAL_TABLET | Freq: Every day | ORAL | 1 refills | Status: DC
Start: 2021-08-10 — End: 2021-11-08

## 2021-08-10 MED ORDER — VALSARTAN-HYDROCHLOROTHIAZIDE 320-12.5 MG PO TABS: 1.0000 | ORAL_TABLET | Freq: Every day | ORAL | 3 refills | Status: DC

## 2021-08-10 MED ORDER — JANUVIA 100 MG PO TABS: 100.0000 mg | ORAL_TABLET | Freq: Every day | ORAL | 0 refills | Status: DC

## 2021-08-10 MED ORDER — AMLODIPINE BESYLATE 10 MG PO TABS: 10.0000 mg | ORAL_TABLET | Freq: Every day | ORAL | 3 refills | Status: DC

## 2021-08-10 MED ORDER — ROSUVASTATIN CALCIUM 10 MG PO TABS: 10.0000 mg | ORAL_TABLET | Freq: Every day | ORAL | 3 refills | Status: DC

## 2021-08-10 MED ORDER — OMEGA-3-ACID ETHYL ESTERS 1 G PO CAPS: 2.0000 g | ORAL_CAPSULE | Freq: Two times a day (BID) | ORAL | 5 refills | Status: DC

## 2021-08-13 ENCOUNTER — Telehealth: Payer: Self-pay

## 2021-08-13 NOTE — Telephone Encounter (Signed)
P.A. LOVAZA denied, switched to [facility]

## 2021-08-14 ENCOUNTER — Telehealth: Payer: Self-pay

## 2021-08-14 NOTE — Telephone Encounter (Signed)
Went ahead and complete P.A. VASCEPA also to see which might be covered

## 2021-09-03 MED ORDER — ICOSAPENT ETHYL 1 G PO CAPS: 2.0000 g | ORAL_CAPSULE | Freq: Two times a day (BID) | ORAL | 5 refills | Status: DC

## 2021-09-03 NOTE — Telephone Encounter (Signed)
P.A. approved til 08/14/21, switched pt to [facility]ce[facility] and will activate discount card. Pt informed

## 2021-09-05 NOTE — Telephone Encounter (Signed)
Called pharmacy with discount card went thru for $9, will order and be in tomorrow.  Called pt and informed

## 2021-09-06 ENCOUNTER — Other Ambulatory Visit: Payer: Self-pay | Admitting: Medical

## 2021-09-19 ENCOUNTER — Ambulatory Visit: Payer: BC Managed Care – PPO | Admitting: Podiatry

## 2021-09-19 ENCOUNTER — Other Ambulatory Visit: Payer: Self-pay

## 2021-09-19 ENCOUNTER — Ambulatory Visit: Payer: BC Managed Care – PPO

## 2021-09-19 DIAGNOSIS — M79674 Pain in right toe(s): Secondary | ICD-10-CM

## 2021-09-19 DIAGNOSIS — M79675 Pain in left toe(s): Secondary | ICD-10-CM

## 2021-09-19 DIAGNOSIS — B351 Tinea unguium: Secondary | ICD-10-CM

## 2021-09-19 NOTE — Progress Notes (Signed)
   SUBJECTIVE Patient with a history of diabetes mellitus presents to office today complaining of elongated, thickened nails that cause pain while ambulating in shoes.  Patient is unable to trim their own nails. Patient is here for further evaluation and treatment.   Past Medical History:  Diagnosis Date   Diabetes mellitus without complication (HCC)    High cholesterol    Hypertension     OBJECTIVE General Patient is awake, alert, and oriented x 3 and in no acute distress. Derm Skin is dry and supple bilateral. Negative open lesions or macerations. Remaining integument unremarkable. Nails are tender, long, thickened and dystrophic with subungual debris, consistent with onychomycosis, 1-5 bilateral. No signs of infection noted. Vasc  DP and PT pedal pulses palpable bilaterally. Temperature gradient within normal limits.  Neuro Epicritic and protective threshold sensation diminished bilaterally.  Musculoskeletal Exam No symptomatic pedal deformities noted bilateral. Muscular strength within normal limits.  ASSESSMENT 1. Diabetes Mellitus w/ peripheral neuropathy 2.  Pain due to onychomycosis of toenails bilateral  PLAN OF CARE 1. Patient evaluated today. 2. Instructed to maintain good pedal hygiene and foot care. Stressed importance of controlling blood sugar.  3. Mechanical debridement of nails 1-5 bilaterally performed using a nail nipper. Filed with dremel without incident.  4. Return to clinic in 3 mos.     Felecia Shelling, DPM [facility]  Dr. Felecia Shelling, DPM    [address] [place], [place] [address]                Office [phone]  Fax [phone]

## 2021-09-21 LAB — HM COLONOSCOPY

## 2021-09-22 ENCOUNTER — Encounter: Payer: Self-pay | Admitting: Internal Medicine

## 2021-09-23 ENCOUNTER — Encounter: Payer: Self-pay | Admitting: Medical

## 2021-11-08 ENCOUNTER — Encounter: Payer: Self-pay | Admitting: Medical

## 2021-11-08 ENCOUNTER — Other Ambulatory Visit: Payer: Self-pay

## 2021-11-08 ENCOUNTER — Ambulatory Visit: Payer: BC Managed Care – PPO | Admitting: Medical

## 2021-11-08 VITALS — BP 138/88 | HR 67 | Ht 66.0 in | Wt 216.0 lb

## 2021-11-08 DIAGNOSIS — Z7185 Encounter for immunization safety counseling: Secondary | ICD-10-CM

## 2021-11-08 DIAGNOSIS — E1140 Type 2 diabetes mellitus with diabetic neuropathy, unspecified: Secondary | ICD-10-CM | POA: Diagnosis not present

## 2021-11-08 DIAGNOSIS — Z23 Encounter for immunization: Secondary | ICD-10-CM | POA: Diagnosis not present

## 2021-11-08 DIAGNOSIS — G5793 Unspecified mononeuropathy of bilateral lower limbs: Secondary | ICD-10-CM

## 2021-11-08 DIAGNOSIS — I10 Essential (primary) hypertension: Secondary | ICD-10-CM

## 2021-11-08 DIAGNOSIS — E1169 Type 2 diabetes mellitus with other specified complication: Secondary | ICD-10-CM

## 2021-11-08 DIAGNOSIS — E785 Hyperlipidemia, unspecified: Secondary | ICD-10-CM

## 2021-11-08 LAB — POCT GLYCOSYLATED HEMOGLOBIN (HGB A1C): Hemoglobin A1C: 7.9 % — AB (ref 4.0–5.6)

## 2021-11-08 MED ORDER — GABAPENTIN 100 MG PO CAPS: 200.0000 mg | ORAL_CAPSULE | Freq: Every day | ORAL | 3 refills | Status: DC

## 2021-11-08 MED ORDER — SEMAGLUTIDE (1 MG/DOSE) 4 MG/3ML ~~LOC~~ SOPN: 1.0000 mg | PEN_INJECTOR | SUBCUTANEOUS | 5 refills | Status: DC

## 2021-11-08 NOTE — Patient Instructions (Addendum)
Recommendations: STOP Januvia when you run out of your current pills Increase the Ozempic to 1mg  injection weekly Contineu Metformin Continue your other medications for blood pressure and cholesterol Begin Gabapentin 100mg  1 tablet /capsule daily for the first week, the increase to 2 tablets daily in the evening.   This is for neuropathy/nerve damage in the feet likely due to diabetes  Plan to recheck fasting in 3-4 months   Diabetic Neuropathy Diabetic neuropathy refers to nerve damage that is caused by diabetes. Over time, people with diabetes can develop nerve damage throughout the body. There are several types of diabetic neuropathy: Peripheral neuropathy. This is the most common type of diabetic neuropathy. It damages the nerves that carry signals between the spinal cord and other parts of the body (peripheral nerves). This usually affects nerves in the feet, legs, hands, and arms. Autonomic neuropathy. This type causes damage to nerves that control involuntary functions (autonomic nerves). Involuntary functions are functions of the body that you do not control. They include heartbeat, body temperature, blood pressure, urination, digestion, sweating, sexual function, or response to changes in blood glucose. Focal neuropathy. This type of nerve damage affects one area of the body, such as an arm, a leg, or the face. The injury may involve one nerve or a small group of nerves. Focal neuropathy can be painful and unpredictable. It occurs most often in older adults with diabetes. This often develops suddenly, but usually improves over time and does not cause long-term problems. Proximal neuropathy. This type of nerve damage affects the nerves of the thighs, hips, buttocks, or legs. It causes severe pain, weakness, and muscle death (atrophy), usually in the thigh muscles. It is more common among older men and people who have type 2 diabetes. The length of recovery time may vary. What are the  causes? Peripheral, autonomic, and focal neuropathies are caused by diabetes that is not well controlled with treatment. The cause of proximal neuropathy is not known, but it may be caused by inflammation related to uncontrolled blood glucose levels. What are the signs or symptoms? Peripheral neuropathy Peripheral neuropathy develops slowly over time. When the nerves of the feet and legs no longer work, you may experience: Burning, stabbing, or aching pain in the legs or feet. Pain or cramping in the legs or feet. Loss of feeling (numbness) and inability to feel pressure or pain in the feet. This can lead to: Thick calluses or sores on areas of constant pressure. Ulcers. Reduced ability to feel temperature changes. Foot deformities. Muscle weakness. Loss of balance or coordination. Autonomic neuropathy The symptoms of autonomic neuropathy vary depending on which nerves are affected. Symptoms may include: Problems with digestion, such as: Nausea or vomiting. Poor appetite. Bloating. Diarrhea or constipation. Trouble swallowing. Losing weight without trying to. Problems with the heart, blood, and lungs, such as: Dizziness, especially when standing up. Fainting. Shortness of breath. Irregular heartbeat. Bladder problems, such as: Trouble starting or stopping urination. Leaking urine. Trouble emptying the bladder. Urinary tract infections (UTIs). Problems with other body functions, such as: Sweat. You may sweat too much or too little. Temperature. You might get hot easily. Or, you might feel cold more than usual. Sexual function. Men may not be able to get or maintain an erection. Women may have vaginal dryness and difficulty with arousal. Focal neuropathy Symptoms affect only one area of the body. Common symptoms include: Numbness. Tingling. Burning pain. Prickling feeling. Very sensitive skin. Weakness. Inability to move (paralysis). Muscle twitching. Muscles getting  smaller (wasting). Poor coordination. Double or blurred vision. Proximal neuropathy Sudden, severe pain in the hip, thigh, or buttocks. Pain may spread from the back into the legs (sciatica). Pain and numbness in the arms and legs. Tingling. Loss of bladder control or bowel control. Weakness and wasting of thigh muscles. Difficulty getting up from a seated position. Abdominal swelling. Unexplained weight loss. How is this diagnosed? Diagnosis varies depending on the type of neuropathy your health care provider suspects. Peripheral neuropathy Your health care provider will do a neurologic exam. This exam checks your reflexes, how you move, and what you can feel. You may have other tests, such as: Blood tests. Tests of the fluid that surrounds the spinal cord (lumbar puncture). CT scan. MRI. Checking the nerves that control muscles (electromyogram, or EMG). Checking how quickly signals pass through your nerves (nerve conduction study). Checking a small piece of a nerve using a microscope (biopsy). Autonomic neuropathy You may have tests, such as: Tests to measure your blood pressure and heart rate. You may be secured to an exam table that moves you from a lying position to an upright position (table tilt test). Breathing tests to check your lungs. Tests to check how food moves through the digestive system (gastric emptying tests). Blood, sweat, or urine tests. Ultrasound of your bladder. Spinal fluid tests. Focal neuropathy This condition may be diagnosed with: A neurologic exam. CT scan. MRI. EMG. Nerve conduction study. Proximal neuropathy There is no test to diagnose this type of neuropathy. You may have tests to rule out other possible causes of this type of neuropathy. Tests may include: X-rays of your spine and lumbar region. Lumbar puncture. MRI. How is this treated? The goal of treatment is to keep nerve damage from getting worse. Treatment may include: Following  your diabetes management plan. This will help keep your blood glucose level and your A1C level within your target range. This is the most important treatment. Using prescription pain medicine. Follow these instructions at home: Diabetes management Follow your diabetes management plan as told by your health care provider. Check your blood glucose levels. Keep your blood glucose in your target range. Have your A1C level checked at least two times a year, or as often as told. Take over the counter and prescription medicines only as told by your health care provider. This includes insulin and diabetes medicine.  Lifestyle  Do not use any products that contain nicotine or tobacco, such as cigarettes, e-cigarettes, and chewing tobacco. If you need help quitting, ask your health care provider. Be physically active every day. Include strength training and balance exercises. Follow a healthy meal plan. Work with your health care provider to manage your blood pressure. General instructions Ask your health care provider if the medicine prescribed to you requires you to avoid driving or using machinery. Check your skin and feet every day for cuts, bruises, redness, blisters, or sores. Keep all follow-up visits. This is important. Contact a health care provider if: You have burning, stabbing, or aching pain in your legs or feet. You are unable to feel pressure or pain in your feet. You develop problems with digestion, such as: Nausea. Vomiting. Bloating. Constipation. Diarrhea. Abdominal pain. You have difficulty with urination, such as: Inability to control when you urinate (incontinence). Inability to completely empty the bladder (retention). You feel as if your heart is racing (palpitations). You feel dizzy, weak, or faint when you stand up. Get help right away if: You cannot urinate. You have sudden weakness  or loss of coordination. You have trouble speaking. You have pain or pressure  in your chest. You have an irregular heartbeat. You have sudden inability to move a part of your body. These symptoms may represent a serious problem that is an emergency. Do not wait to see if the symptoms will go away. Get medical help right away. Call your local emergency services (911 in the [place] Do not drive yourself to the hospital. Summary Diabetic neuropathy is nerve damage that is caused by diabetes. It can cause numbness and pain in the arms, legs, digestive tract, heart, and other body systems. This condition is treated by keeping your blood glucose level and your A1C level within your target range. This can help prevent neuropathy from getting worse. Check your skin and feet every day for cuts, bruises, redness, blisters, or sores. Do not use any products that contain nicotine or tobacco, such as cigarettes, e-cigarettes, and chewing tobacco. This information is not intended to replace advice given to you by your health care provider. Make sure you discuss any questions you have with your health care provider. Document Revised: 03/25/2020 Document Reviewed: 03/25/2020 Elsevier Patient Education  2022 [employer].

## 2021-11-08 NOTE — Progress Notes (Signed)
Subjective:  [name] is a 50 y.o. male who presents for Chief Complaint  Patient presents with   Diabetes     Here for med check on chronic issues  Diabetes-compliant with metformin 1000 mg twice daily, Ozempic 0.5 mg weekly, Januvia daily.  He feels less appetite on this medication.  He has lost some weight taking this medication.  Blood sugars look okay at home.  No hypoglycemia.  No polyuria dysuria or polydipsia.  Hypertension-compliant with amlodipine, carvedilol, valsartan HCT.  Not checking home blood pressure readings.  No chest pain or difficulty breathing.  No edema.  Hyperlipidemia-compliant with Crestor and Vascepa without complaint  He does note that his feet feel hot at times.  Sometimes they feel tingly or numb.  This has been ongoing for months  He would like a COVID booster today  No other aggravating or relieving factors.    No other c/o.  Past Medical History:  Diagnosis Date   Diabetes mellitus without complication (HCC)    High cholesterol    Hypertension    Current Outpatient Medications on File Prior to Visit  Medication Sig Dispense Refill   amLODipine (NORVASC) 10 MG tablet Take 1 tablet (10 mg total) by mouth daily. 90 tablet 3   carvedilol (COREG) 6.25 MG tablet TAKE 1 TABLET BY MOUTH TWICE A DAY 180 tablet 1   icosapent Ethyl (VASCEPA) 1 g capsule Take 2 capsules (2 g total) by mouth 2 (two) times daily. 120 capsule 5   metFORMIN (GLUCOPHAGE) 1000 MG tablet Take 1 tablet (1,000 mg total) by mouth 2 (two) times daily with a meal. 180 tablet 1   Multiple Vitamins-Minerals (MULTIVITAMIN WITH MINERALS) tablet Take 1 tablet by mouth daily.     rosuvastatin (CRESTOR) 10 MG tablet Take 1 tablet (10 mg total) by mouth daily. 90 tablet 3   valsartan-hydrochlorothiazide (DIOVAN-HCT) 320-12.5 MG tablet Take 1 tablet by mouth daily. 90 tablet 3   No current facility-administered medications on file prior to visit.     The following portions of  the patient's history were reviewed and updated as appropriate: allergies, current medications, past family history, past medical history, past social history, past surgical history and problem list.  ROS Otherwise as in subjective above  Objective: BP 138/88 (BP Location: Right Arm, Patient Position: Sitting)    Pulse 67    Ht 5\' 6"  (1.676 m)    Wt 216 lb (98 kg)    SpO2 97%    BMI 34.86 kg/m   General appearance: alert, no distress, well developed, well nourished Neck: supple, no lymphadenopathy, no thyromegaly, no masses, no bruits Heart: RRR, normal S1, S2, no murmurs Lungs: CTA bilaterally, no wheezes, rhonchi, or rales Pulses: 2+ radial pulses, 2+ pedal pulses, normal cap refill Ext: no edema  Diabetic Foot Exam - Simple   Simple Foot Form Diabetic Foot exam was performed with the following findings: Yes 11/08/2021  1:18 PM  Visual Inspection See comments: Yes Sensation Testing See comments: Yes Pulse Check Posterior Tibialis and Dorsalis pulse intact bilaterally: Yes Comments Left lateral toes with somewhat decreased sensation of monofilament compared to the right but otherwise sensation intact, some thickened nails particularly great toenails bilaterally, no other deformity      Assessment: Encounter Diagnoses  Name Primary?   Type 2 diabetes mellitus with diabetic neuropathy, unspecified whether long term insulin use (HCC) Yes   Need for COVID-19 vaccine    Vaccine counseling    Primary hypertension  Hyperlipidemia associated with type 2 diabetes mellitus (HCC)    Neuropathy of both feet      Plan: Diabetes-continue current medications, hemoglobin A1c not at goal but improved from prior visit.  Continue efforts to eat a healthy low sugar diet.  Increase Ozempic to 1 mg weekly.  Continue metformin 1000 mg twice daily.  Stop Januvia at this time  Hypertension-continue current medications  Hyperlipidemia-continue current medications.  Reviewed labs from  September 2022 well visit  Counseled on the Covid virus vaccine.  Vaccine information sheet given.  Covid vaccine given after consent obtained.  Neuropathy of feet likely due to diabetes-begin trial of gabapentin.  Discussed risk and benefits and proper use of medication.  Raymond was seen today for diabetes.  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 -     gabapentin (NEURONTIN) 100 MG capsule; Take 2 capsules (200 mg total) by mouth at bedtime.   Follow up: 3-4 mo fasting

## 2021-11-17 LAB — HM DIABETES EYE EXAM

## 2022-01-02 ENCOUNTER — Ambulatory Visit (INDEPENDENT_AMBULATORY_CARE_PROVIDER_SITE_OTHER): Payer: Self-pay | Admitting: Podiatry

## 2022-01-02 ENCOUNTER — Other Ambulatory Visit: Payer: Self-pay | Admitting: Cardiology

## 2022-01-02 DIAGNOSIS — Z91199 Patient's noncompliance with other medical treatment and regimen due to unspecified reason: Secondary | ICD-10-CM

## 2022-01-02 DIAGNOSIS — I10 Essential (primary) hypertension: Secondary | ICD-10-CM

## 2022-01-07 NOTE — Progress Notes (Signed)
   Complete physical exam  Patient: [name]   DOB: 09/16/1999   51 y.o. Male  MRN: [id number]  Subjective:    No chief complaint on file.   [name] is a 51 y.o. male who presents today for a complete physical exam. She reports consuming a {diet types:17450} diet. {types:19826} She generally feels {DESC; WELL/FAIRLY WELL/POORLY:18703}. She reports sleeping {DESC; WELL/FAIRLY WELL/POORLY:18703}. She {does/does not:200015} have additional problems to discuss today.    Most recent fall risk assessment:    05/24/2022   10:42 AM  Fall Risk   Falls in the past year? 0  Number falls in past yr: 0  Injury with Fall? 0  Risk for fall due to : No Fall Risks  Follow up Falls evaluation completed     Most recent depression screenings:    05/24/2022   10:42 AM 04/14/2021   10:46 AM  PHQ 2/9 Scores  PHQ - 2 Score 0 0  PHQ- 9 Score 5     {VISON DENTAL STD PSA (Optional):27386}  {History (Optional):23778}  Patient Care Team: Jessup, Joy, NP as PCP - General (Nurse Practitioner)   Outpatient Medications Prior to Visit  Medication Sig   fluticasone (FLONASE) 50 MCG/ACT nasal spray Place 2 sprays into both nostrils in the morning and at bedtime. After 7 days, reduce to once daily.   norgestimate-ethinyl estradiol (SPRINTEC 28) 0.25-35 MG-MCG tablet Take 1 tablet by mouth daily.   Nystatin POWD Apply liberally to affected area 2 times per day   spironolactone (ALDACTONE) 100 MG tablet Take 1 tablet (100 mg total) by mouth daily.   No facility-administered medications prior to visit.    ROS        Objective:     There were no vitals taken for this visit. {Vitals History (Optional):23777}  Physical Exam   No results found for any visits on 06/29/22. {Show previous labs (optional):23779}    Assessment & Plan:    Routine Health Maintenance and Physical Exam  Immunization History  Administered Date(s) Administered   DTaP 11/30/1999, 01/26/2000,  04/05/2000, 12/20/2000, 07/05/2004   Hepatitis A 05/01/2008, 05/07/2009   Hepatitis B 09/17/1999, 10/25/1999, 04/05/2000   HiB (PRP-OMP) 11/30/1999, 01/26/2000, 04/05/2000, 12/20/2000   IPV 11/30/1999, 01/26/2000, 09/24/2000, 07/05/2004   Influenza,inj,Quad PF,6+ Mos 08/07/2014   Influenza-Unspecified 11/06/2012   MMR 09/24/2001, 07/05/2004   Meningococcal Polysaccharide 05/06/2012   Pneumococcal Conjugate-13 12/20/2000   Pneumococcal-Unspecified 04/05/2000, 06/19/2000   Tdap 05/06/2012   Varicella 09/24/2000, 05/01/2008    Health Maintenance  Topic Date Due   HIV Screening  Never done   Hepatitis C Screening  Never done   INFLUENZA VACCINE  06/27/2022   PAP-Cervical Cytology Screening  06/29/2022 (Originally 09/15/2020)   PAP SMEAR-Modifier  06/29/2022 (Originally 09/15/2020)   TETANUS/TDAP  06/29/2022 (Originally 05/06/2022)   HPV VACCINES  Discontinued   COVID-19 Vaccine  Discontinued    Discussed health benefits of physical activity, and encouraged her to engage in regular exercise appropriate for her age and condition.  Problem List Items Addressed This Visit   None Visit Diagnoses     Annual physical exam    -  Primary   Cervical cancer screening       Need for Tdap vaccination          No follow-ups on file.     Joy Jessup, NP   

## 2022-01-10 ENCOUNTER — Telehealth: Payer: BC Managed Care – PPO | Admitting: Physician Assistant

## 2022-01-10 ENCOUNTER — Other Ambulatory Visit: Payer: Self-pay

## 2022-01-10 ENCOUNTER — Encounter: Payer: Self-pay | Admitting: Physician Assistant

## 2022-01-10 DIAGNOSIS — J3089 Other allergic rhinitis: Secondary | ICD-10-CM | POA: Diagnosis not present

## 2022-01-10 DIAGNOSIS — J301 Allergic rhinitis due to pollen: Secondary | ICD-10-CM | POA: Diagnosis not present

## 2022-01-10 DIAGNOSIS — H1045 Other chronic allergic conjunctivitis: Secondary | ICD-10-CM | POA: Insufficient documentation

## 2022-01-10 DIAGNOSIS — E119 Type 2 diabetes mellitus without complications: Secondary | ICD-10-CM

## 2022-01-10 DIAGNOSIS — J3081 Allergic rhinitis due to animal (cat) (dog) hair and dander: Secondary | ICD-10-CM | POA: Diagnosis not present

## 2022-01-10 DIAGNOSIS — J309 Allergic rhinitis, unspecified: Secondary | ICD-10-CM | POA: Insufficient documentation

## 2022-01-10 MED ORDER — FLUTICASONE PROPIONATE 50 MCG/ACT NA SUSP: 2.0000 | Freq: Every day | NASAL | 3 refills | Status: DC

## 2022-01-10 MED ORDER — LEVOCETIRIZINE DIHYDROCHLORIDE 5 MG PO TABS: 5.0000 mg | ORAL_TABLET | Freq: Every evening | ORAL | 2 refills | Status: DC

## 2022-01-10 NOTE — Progress Notes (Signed)
Virtual Visit via Video Note  I connected with [facility] on 01/10/22 by a video enabled telemedicine application and verified that I am speaking with the correct person using two identifiers.  Location: Patient: home Provider: office   I discussed the limitations of evaluation and management by telemedicine and the availability of in person appointments. The patient expressed understanding and agreed to proceed.  History of Present Illness:  Chief Complaint  Patient presents with   Nasal Congestion   Itchy Eyes   Sneezing   Fatigue    Weakness       Observations/Objective:  There were no vitals taken for this visit.   Assessment and Plan:  Environmental allergies / allergic rhinitis - stable, increase clear fluids, rest, OTC Tylenol prn, Xyzal and Flonase prescribed. Follow up with Allergist at [facility] for follow up.   Follow Up Instructions:    I discussed the assessment and treatment plan with the patient. The patient was provided an opportunity to ask questions and all were answered. The patient agreed with the plan and demonstrated an understanding of the instructions.   The patient was advised to call back or seek an in-person evaluation if the symptoms worsen or if the condition fails to improve as anticipated.  I spent 15 minutes dedicated to the care of this patient, including pre-visit review of records, face to face time, post-visit ordering of testing and documentation.    Irene Pap, PA-C    Acute Office Visit  Subjective:    Patient ID: [name], male    DOB: 07-02-1971, 51 y.o.   MRN: [id number]  Chief Complaint  Patient presents with   Nasal Congestion   Itchy Eyes   Sneezing   Fatigue    Weakness       Past Medical History:  Diagnosis Date   Diabetes mellitus without complication (HCC)    High cholesterol    Hypertension     Past Surgical History:  Procedure Laterality Date   BACK SURGERY      SHOULDER OPEN ROTATOR CUFF REPAIR      Family History  Problem Relation Age of Onset   Alzheimer's disease Mother    Stroke Mother    Stroke Father    Heart disease Father 5       died of MI   Hypertension Father    Hyperlipidemia Father    Diabetes Father    Diabetes Sister    Diabetes Brother    Cancer Neg Hx     Social History   Socioeconomic History   Marital status: Married    Spouse name: Not on file   Number of children: 4   Years of education: Not on file   Highest education level: Not on file  Occupational History   Not on file  Tobacco Use   Smoking status: Never   Smokeless tobacco: Never  Vaping Use   Vaping Use: Never used  Substance and Sexual Activity   Alcohol use: Not Currently   Drug use: Never   Sexual activity: Yes  Other Topics Concern   Not on file  Social History Narrative   Lives wife and dog.  3 daughters.  Owns car lot, sells car, Research officer, trade [employer].   07/2021   Social Determinants of Health   Financial Resource Strain: Not on file  Food Insecurity: Not on file  Transportation Needs: Not on file  Physical Activity: Not on file  Stress: Not on file  Social  Connections: Not on file  Intimate Partner Violence: Not on file    Outpatient Medications Prior to Visit  Medication Sig Dispense Refill   amLODipine (NORVASC) 10 MG tablet Take 1 tablet (10 mg total) by mouth daily. 90 tablet 3   carvedilol (COREG) 6.25 MG tablet TAKE 1 TABLET BY MOUTH TWICE A DAY 180 tablet 1   gabapentin (NEURONTIN) 100 MG capsule Take 2 capsules (200 mg total) by mouth at bedtime. 60 capsule 3   icosapent Ethyl (VASCEPA) 1 g capsule Take 2 capsules (2 g total) by mouth 2 (two) times daily. 120 capsule 5   metFORMIN (GLUCOPHAGE) 1000 MG tablet Take 1 tablet (1,000 mg total) by mouth 2 (two) times daily with a meal. 180 tablet 1   Multiple Vitamins-Minerals (MULTIVITAMIN WITH MINERALS) tablet Take 1 tablet by mouth daily.     rosuvastatin (CRESTOR) 10 MG tablet  Take 1 tablet (10 mg total) by mouth daily. 90 tablet 3   Semaglutide, 1 MG/DOSE, 4 MG/3ML SOPN Inject 1 mg as directed once a week. 3 mL 5   valsartan-hydrochlorothiazide (DIOVAN-HCT) 320-12.5 MG tablet Take 1 tablet by mouth daily. 90 tablet 3   CLENPIQ 10-3.5-12 MG-GM -GM/160ML SOLN      No facility-administered medications prior to visit.    Allergies  Allergen Reactions   Morphine And Related Other (See Comments)    CAUSES SEVERE PRESSURE (FEELING) IN HIS HEAD    Review of Systems  Constitutional:  Negative for diaphoresis.  HENT:  Negative for drooling and voice change.   Eyes:  Negative for redness.  Respiratory:  Negative for cough and wheezing.   Gastrointestinal:  Negative for constipation, diarrhea, nausea and vomiting.  Skin:  Negative for rash.  Allergic/Immunologic: Positive for environmental allergies.  Neurological:  Negative for headaches.  Psychiatric/Behavioral:  Negative for agitation.       Objective:    Physical Exam Constitutional:      General: He is not in acute distress. HENT:     Head: Normocephalic and atraumatic.  Eyes:     Conjunctiva/sclera: Conjunctivae normal.     Pupils: Pupils are equal, round, and reactive to light.  Pulmonary:     Effort: Pulmonary effort is normal. No respiratory distress.  Musculoskeletal:     Cervical back: Normal range of motion and neck supple.  Skin:    Findings: No bruising or erythema.  Neurological:     Mental Status: He is alert and oriented to person, place, and time.  Psychiatric:        Mood and Affect: Mood normal.        Behavior: Behavior normal.    There were no vitals taken for this visit. Wt Readings from Last 3 Encounters:  11/08/21 216 lb (98 kg)  08/09/21 223 lb 6.4 oz (101.3 kg)  12/22/20 222 lb 6.4 oz (100.9 kg)    Health Maintenance Due  Topic Date Due   OPHTHALMOLOGY EXAM  Never done   Zoster Vaccines- Shingrix (1 of 2) Never done    There are no preventive care reminders to  display for this patient.   No results found for: TSH Lab Results  Component Value Date   WBC 8.7 08/09/2021   HGB 14.0 08/09/2021   HCT 42.3 08/09/2021   MCV 75 (L) 08/09/2021   PLT 287 08/09/2021   Lab Results  Component Value Date   NA 138 08/09/2021   K 3.7 08/09/2021   CO2 23 08/09/2021   GLUCOSE 229 (H) 08/09/2021  BUN 10 08/09/2021   CREATININE 0.80 08/09/2021   BILITOT 0.3 08/09/2021   ALKPHOS 86 08/09/2021   AST 36 08/09/2021   ALT 54 (H) 08/09/2021   PROT 6.8 08/09/2021   ALBUMIN 4.7 08/09/2021   CALCIUM 9.5 08/09/2021   ANIONGAP 12 08/11/2016   EGFR 108 08/09/2021   Lab Results  Component Value Date   CHOL 146 08/09/2021   Lab Results  Component Value Date   HDL 37 (L) 08/09/2021   Lab Results  Component Value Date   LDLCALC 72 08/09/2021   Lab Results  Component Value Date   TRIG 226 (H) 08/09/2021   Lab Results  Component Value Date   CHOLHDL 3.9 08/09/2021   Lab Results  Component Value Date   HGBA1C 7.9 (A) 11/08/2021       Assessment & Plan:   Problem List Items Addressed This Visit       Respiratory   Allergic rhinitis due to animal (cat) (dog) hair and dander   Relevant Medications   fluticasone (FLONASE) 50 MCG/ACT nasal spray   levocetirizine (XYZAL) 5 MG tablet   Allergic rhinitis due to pollen   Relevant Medications   fluticasone (FLONASE) 50 MCG/ACT nasal spray   levocetirizine (XYZAL) 5 MG tablet     Endocrine   Type 2 diabetes mellitus without complication, without long-term current use of insulin (HCC)     Other   Environmental and seasonal allergies - Primary   Relevant Medications   fluticasone (FLONASE) 50 MCG/ACT nasal spray   levocetirizine (XYZAL) 5 MG tablet     Meds ordered this encounter  Medications   fluticasone (FLONASE) 50 MCG/ACT nasal spray    Sig: Place 2 sprays into both nostrils daily.    Dispense:  16 g    Refill:  3    Order Specific Question:   Supervising Provider    Answer:    Denita Lung [[id number]]   levocetirizine (XYZAL) 5 MG tablet    Sig: Take 1 tablet (5 mg total) by mouth every evening. For allergic rhinitis    Dispense:  90 tablet    Refill:  2    Order Specific Question:   Supervising Provider    Answer:   Denita Lung [place], PA-C

## 2022-01-10 NOTE — Patient Instructions (Signed)
For nasal congestion and post nasal drip you can use an OTC nasal saline rinse.

## 2022-02-07 ENCOUNTER — Encounter: Payer: BC Managed Care – PPO | Admitting: Medical

## 2022-02-13 ENCOUNTER — Encounter: Payer: Self-pay | Admitting: Medical

## 2022-03-15 ENCOUNTER — Other Ambulatory Visit: Payer: Self-pay | Admitting: Medical

## 2022-03-17 ENCOUNTER — Telehealth: Payer: Self-pay

## 2022-03-17 NOTE — Telephone Encounter (Signed)
Pt. Scheduled a f/u med check on 03/22/22 he wants to know if he could come in the day before for lab work if you could out the order in. I will call him back and let him know and get him scheduled for a lab visit.  ?

## 2022-03-20 ENCOUNTER — Other Ambulatory Visit: Payer: Self-pay | Admitting: Medical

## 2022-03-20 DIAGNOSIS — E1169 Type 2 diabetes mellitus with other specified complication: Secondary | ICD-10-CM

## 2022-03-20 DIAGNOSIS — I10 Essential (primary) hypertension: Secondary | ICD-10-CM

## 2022-03-20 DIAGNOSIS — E119 Type 2 diabetes mellitus without complications: Secondary | ICD-10-CM

## 2022-03-21 ENCOUNTER — Other Ambulatory Visit: Payer: BC Managed Care – PPO

## 2022-03-21 DIAGNOSIS — E119 Type 2 diabetes mellitus without complications: Secondary | ICD-10-CM

## 2022-03-21 DIAGNOSIS — E1169 Type 2 diabetes mellitus with other specified complication: Secondary | ICD-10-CM

## 2022-03-22 ENCOUNTER — Ambulatory Visit: Payer: BC Managed Care – PPO | Admitting: Medical

## 2022-03-22 VITALS — BP 138/88 | HR 67 | Wt 219.2 lb

## 2022-03-22 DIAGNOSIS — R0683 Snoring: Secondary | ICD-10-CM

## 2022-03-22 DIAGNOSIS — E1140 Type 2 diabetes mellitus with diabetic neuropathy, unspecified: Secondary | ICD-10-CM | POA: Diagnosis not present

## 2022-03-22 DIAGNOSIS — I10 Essential (primary) hypertension: Secondary | ICD-10-CM | POA: Diagnosis not present

## 2022-03-22 DIAGNOSIS — E785 Hyperlipidemia, unspecified: Secondary | ICD-10-CM

## 2022-03-22 DIAGNOSIS — K5790 Diverticulosis of intestine, part unspecified, without perforation or abscess without bleeding: Secondary | ICD-10-CM | POA: Insufficient documentation

## 2022-03-22 DIAGNOSIS — E1169 Type 2 diabetes mellitus with other specified complication: Secondary | ICD-10-CM | POA: Diagnosis not present

## 2022-03-22 DIAGNOSIS — R400 Somnolence: Secondary | ICD-10-CM

## 2022-03-22 LAB — LIPID PANEL
Chol/HDL Ratio: 3.1 ratio (ref 0.0–5.0)
Cholesterol, Total: 110 mg/dL (ref 100–199)
HDL: 36 mg/dL — ABNORMAL LOW (ref >39)
LDL Chol Calc (NIH): 53 mg/dL (ref 0–99)
Triglycerides: 118 mg/dL (ref 0–149)
VLDL Cholesterol Cal: 21 mg/dL (ref 5–40)

## 2022-03-22 LAB — HEMOGLOBIN A1C
Est. average glucose Bld gHb Est-mCnc: 186 mg/dL
Hgb A1c MFr Bld: 8.1 % — ABNORMAL HIGH (ref 4.8–5.6)

## 2022-03-22 MED ORDER — SEMAGLUTIDE (2 MG/DOSE) 8 MG/3ML ~~LOC~~ SOPN: 2.0000 mg | PEN_INJECTOR | SUBCUTANEOUS | 5 refills | Status: DC

## 2022-03-22 MED ORDER — GABAPENTIN 100 MG PO CAPS: 200.0000 mg | ORAL_CAPSULE | Freq: Every day | ORAL | 5 refills | Status: DC

## 2022-03-22 NOTE — Assessment & Plan Note (Signed)
Controlled, continue current therapy

## 2022-03-22 NOTE — Assessment & Plan Note (Signed)
Continue current therapy with Vascepa and rosuvastatin 10 mg daily. ?

## 2022-03-22 NOTE — Assessment & Plan Note (Signed)
We discussed potential risk of untreated sleep apnea.  Discussed need for weight loss. ?

## 2022-03-22 NOTE — Assessment & Plan Note (Signed)
We discussed his occasional symptoms of left lower quadrant pain.  He does have diverticulosis on prior colonoscopy.  We discussed potential signs of diverticulitis ?

## 2022-03-22 NOTE — Assessment & Plan Note (Addendum)
Unfortunately hemoglobin A1c is not at goal.  Continue metformin 1000 mg twice daily, continue Ozempic but increase to 2 mg  weekly.  We discussed other therapies but he wants to stick with this regimen for now but at a higher dose with the Ozempic.  Discussed potential risk of medication.  He does see improvement on gabapentin since last visit continue gabapentin. ?

## 2022-03-22 NOTE — Assessment & Plan Note (Signed)
We discussed potential side effects of medication such as his beta-blocker and gabapentin.  He seems be tolerating these just fine.  We also discussed the sedative effects of antihistamine as well.  We will pursue sleep study though given underlying symptoms. ?

## 2022-03-22 NOTE — Patient Instructions (Signed)
Diabetes: ?I sent a new prescription for higher dose of Ozempic.  Begin the 2 mg Ozempic weekly ?Continue metformin twice daily ?Limit sugary drinks including soda and alcohol ?Eat a Healthy low-fat low sugar diet ?Avoid big portions of breads and rice and potatoes ? ? ?We will schedule you for a sleep study to further evaluate snoring and daytime sleepiness. ? ? ?Regarding neuropathy, burning sensation in the extremities, since you have seen improvement on gabapentin, you can either continue 2 capsules nightly or go back to 1 capsule nightly as long as this works. ? ?

## 2022-03-22 NOTE — Progress Notes (Signed)
Referred to [facility] for sleep study ?

## 2022-03-22 NOTE — Progress Notes (Signed)
? ?Established Patient Office Visit ? ?Subjective   ?Patient ID: [name], male    DOB: 10/19/1971  Age: 51 y.o. MRN: [id number] ? ?Chief Complaint  ?Patient presents with  ? med check  ?  Med check, no concerns, needs refills. [facility] exam done at [facility]  ? ?Diabetes ?He presents for his follow-up diabetic visit. He has type 2 diabetes mellitus. Current diabetic treatments: Metformin 1000 mg twice daily, Ozempic 1 mg increase last visit from 0.5 mg weekly.  ?Hypertension ?This is a chronic problem. There are no compliance problems (Compliant with amlodipine 10 mg daily, carvedilol 6.25 mg twice daily, valsartan HCT 320/12.5 mg daily).   ?Hyperlipidemia ?This is a chronic problem. There are no compliance problems (Compliant with rosuvastatin 10 mg daily, Vascepa 2 capsules twice daily).   ?Neurologic Problem ?Treatments tried: Last visit we began trial of gabapentin 100 mg twice daily.  ? ?He has felt more sleepy of late.  He is sleepy during the day.  His wife says he snores really loud and sometimes other 5 members comment on his snoring. ? ?Occasionally gets some left lower quadrant abdominal discomfort.  Lately having normal bowel movements.  No blood in the stool.  No fever.  No history of diverticulitis.  On his last colonoscopy his prep was not very good so he will need to repeat this within the year ? ? ?Patient Active Problem List  ? Diagnosis Date Noted  ? Daytime somnolence 03/22/2022  ? Snoring 03/22/2022  ? Diverticulosis 03/22/2022  ? Environmental and seasonal allergies 01/10/2022  ? Allergic rhinitis due to animal (cat) (dog) hair and dander 01/10/2022  ? Allergic rhinitis due to pollen 01/10/2022  ? Chronic allergic conjunctivitis 01/10/2022  ? Need for COVID-19 vaccine 11/08/2021  ? Neuropathy of both feet 11/08/2021  ? Need for pneumococcal vaccination 08/09/2021  ? Screen for colon cancer 08/09/2021  ? Screening for prostate cancer 08/09/2021  ? Onychomycosis 08/09/2021  ? Type 2 diabetes  mellitus with diabetic neuropathy (HCC) 08/09/2021  ? Cyst of skin 08/09/2021  ? Depression, major, single episode, in partial remission (HCC) 08/09/2021  ? Encounter for health maintenance examination in adult 08/05/2020  ? Hyperlipidemia associated with type 2 diabetes mellitus (HCC) 08/05/2020  ? Vaccine counseling 08/05/2020  ? Need for influenza vaccination 08/05/2020  ? Erectile dysfunction 03/22/2020  ? Diabetes mellitus type 2 with complications, uncontrolled 02/18/2020  ? Primary hypertension 02/18/2020  ? ?Past Medical History:  ?Diagnosis Date  ? Diabetes mellitus without complication (HCC)   ? High cholesterol   ? Hypertension   ? ?Past Surgical History:  ?Procedure Laterality Date  ? BACK SURGERY    ? SHOULDER OPEN ROTATOR CUFF REPAIR    ? ?Social History  ? ?Tobacco Use  ? Smoking status: Never  ? Smokeless tobacco: Never  ?Vaping Use  ? Vaping Use: Never used  ?Substance Use Topics  ? Alcohol use: Not Currently  ? Drug use: Never  ? ?Family History  ?Problem Relation Age of Onset  ? Alzheimer's disease Mother   ? Stroke Mother   ? Stroke Father   ? Heart disease Father 34  ?     died of MI  ? Hypertension Father   ? Hyperlipidemia Father   ? Diabetes Father   ? Diabetes Sister   ? Diabetes Brother   ? Cancer Neg Hx   ? ?Allergies  ?Allergen Reactions  ? Morphine And Related Other (See Comments)  ?  CAUSES SEVERE PRESSURE (  FEELING) IN HIS HEAD  ? ?  ? ?ROS ?As in subjective ? ? ? ?Objective:  ?  ? ?BP 138/88   Pulse 67   Wt 219 lb 3.2 oz (99.4 kg)   BMI 35.38 kg/m?  ? ? ?Wt Readings from Last 3 Encounters:  ?03/22/22 219 lb 3.2 oz (99.4 kg)  ?11/08/21 216 lb (98 kg)  ?08/09/21 223 lb 6.4 oz (101.3 kg)  ? ? ?Physical Exam ?General appearence: alert, no distress, WD/WN,  ?Neck: supple, no lymphadenopathy, no thyromegaly, no masses ?Heart: RRR, normal S1, S2, no murmurs ?Lungs: CTA bilaterally, no wheezes, rhonchi, or rales ?Abdomen: +bs, soft, non tender, non distended, no masses, no hepatomegaly, no  splenomegaly ?Pulses: 2+ symmetric, upper and lower extremities, normal cap refill ?Ext: no edema ? ? ? ?The ASCVD Risk score (Arnett DK, et al., 2019) failed to calculate for the following reasons: ?  The valid total cholesterol range is 130 to 320 mg/dL ? ?  ?Assessment & Plan:  ? ?Problem List Items Addressed This Visit   ? ? Primary hypertension  ?  Controlled, continue current therapy ? ?  ?  ? Hyperlipidemia associated with type 2 diabetes mellitus (HCC)  ?  Continue current therapy with Vascepa and rosuvastatin 10 mg daily. ? ?  ?  ? Relevant Medications  ? Semaglutide, 2 MG/DOSE, 8 MG/3ML SOPN  ? Type 2 diabetes mellitus with diabetic neuropathy (HCC) - Primary  ?  Unfortunately hemoglobin A1c is not at goal.  Continue metformin 1000 mg twice daily, continue Ozempic but increase to 2 mg  weekly.  We discussed other therapies but he wants to stick with this regimen for now but at a higher dose with the Ozempic.  Discussed potential risk of medication.  He does see improvement on gabapentin since last visit continue gabapentin. ? ?  ?  ? Relevant Medications  ? Semaglutide, 2 MG/DOSE, 8 MG/3ML SOPN  ? Daytime somnolence  ?  We discussed potential side effects of medication such as his beta-blocker and gabapentin.  He seems be tolerating these just fine.  We also discussed the sedative effects of antihistamine as well.  We will pursue sleep study though given underlying symptoms. ? ?  ?  ? Snoring  ?  We discussed potential risk of untreated sleep apnea.  Discussed need for weight loss. ? ?  ?  ? Diverticulosis  ?  We discussed his occasional symptoms of left lower quadrant pain.  He does have diverticulosis on prior colonoscopy.  We discussed potential signs of diverticulitis ? ?  ?  ? ? ?F/u pending sleep study ? ? ?Kristian Covey, PA-C ? ?

## 2022-04-22 ENCOUNTER — Other Ambulatory Visit: Payer: Self-pay | Admitting: Medical

## 2022-04-25 NOTE — Telephone Encounter (Signed)
Pt has an appt in october 

## 2022-05-08 ENCOUNTER — Ambulatory Visit: Payer: BC Managed Care – PPO | Admitting: Medical

## 2022-05-08 VITALS — BP 120/78 | HR 78 | Temp 98.7°F | Wt 218.0 lb

## 2022-05-08 DIAGNOSIS — E1140 Type 2 diabetes mellitus with diabetic neuropathy, unspecified: Secondary | ICD-10-CM | POA: Diagnosis not present

## 2022-05-08 DIAGNOSIS — L600 Ingrowing nail: Secondary | ICD-10-CM | POA: Diagnosis not present

## 2022-05-08 MED ORDER — AMOXICILLIN-POT CLAVULANATE 875-125 MG PO TABS: 1.0000 | ORAL_TABLET | Freq: Two times a day (BID) | ORAL | 0 refills | Status: DC

## 2022-05-08 NOTE — Patient Instructions (Signed)
Ingrown toenail with infection Begin Augmentin antibiotic twice daily for 10 days Begin Ibuprofen over the counter 400mg  - 600mg  twice daily for 3-5 days.   Over the dosing is typically 200mg .  Thus, 3 of the 200mg  would equal 600mg .   Do some foot soaks with hot soapy water 20 minutes twice daily if possible Follow up with podiatry/foot doctor in a week as planned

## 2022-05-08 NOTE — Progress Notes (Signed)
Subjective:  [name] is a 51 y.o. male who presents for Chief Complaint  Patient presents with   Ingrown Toenail    Ingrown toenail. Noticed it last Thursday. Pain under the foot. Trimmed nail the other day. Has an appointment with podiatry on Monday but pain.      Here for ingrown toenail.  Been dealing with this a week.  Left great toenail with pain, pus, redness and swelling . A little better today than the last few days.   No fever.  Has appt with podiatry already scheduled for 1 week from today. has had prior ingrown toenails .   Diabetes - glucose fasting lately 140 or less.  Otherwise in normal state of health.   No other aggravating or relieving factors.    No other c/o.  The following portions of the patient's history were reviewed and updated as appropriate: allergies, current medications, past family history, past medical history, past social history, past surgical history and problem list.  ROS Otherwise as in subjective above  Objective: BP 120/78   Pulse 78   Temp 98.7 F (37.1 C)   Wt 218 lb (98.9 kg)   BMI 35.19 kg/m   General appearance: alert, no distress, well developed, well nourished Left great toenail thickened, ingrowing on lateral corner ,and pus discharge from lateral nailbed.  Mild swelling . No fluctuance, induration Feet otherwise neurovascularly intact    Assessment: Encounter Diagnoses  Name Primary?   Ingrown left big toenail Yes   Type 2 diabetes mellitus with diabetic neuropathy, unspecified whether long term insulin use (HCC)      Plan: Infected ingrown toenail - begin Augmentin, do salt water foot soaks and f/u with podiatry in 1 week as planned  Diabetes - home readings improved.  Continue metformin 1000 mg BID, ozempic 2mg  weekly  [name] was seen today for ingrown toenail.  Diagnoses and all orders for this visit:  Ingrown left big toenail  Type 2 diabetes mellitus with diabetic neuropathy, unspecified whether long  term insulin use (HCC)  Other orders -     amoxicillin-clavulanate (AUGMENTIN) 875-125 MG tablet; Take 1 tablet by mouth 2 (two) times daily.    Follow up: late July for med check fasting

## 2022-05-17 ENCOUNTER — Ambulatory Visit: Payer: BC Managed Care – PPO | Admitting: Podiatry

## 2022-05-24 ENCOUNTER — Other Ambulatory Visit: Payer: Self-pay | Admitting: Medical

## 2022-06-07 ENCOUNTER — Ambulatory Visit (INDEPENDENT_AMBULATORY_CARE_PROVIDER_SITE_OTHER): Payer: BC Managed Care – PPO | Admitting: Podiatry

## 2022-06-07 DIAGNOSIS — L600 Ingrowing nail: Secondary | ICD-10-CM

## 2022-06-07 DIAGNOSIS — M79675 Pain in left toe(s): Secondary | ICD-10-CM

## 2022-06-07 DIAGNOSIS — B351 Tinea unguium: Secondary | ICD-10-CM

## 2022-06-07 DIAGNOSIS — M79674 Pain in right toe(s): Secondary | ICD-10-CM | POA: Diagnosis not present

## 2022-06-12 ENCOUNTER — Other Ambulatory Visit: Payer: Self-pay | Admitting: Cardiology

## 2022-06-12 ENCOUNTER — Other Ambulatory Visit: Payer: Self-pay | Admitting: Medical

## 2022-06-12 DIAGNOSIS — I10 Essential (primary) hypertension: Secondary | ICD-10-CM

## 2022-06-16 NOTE — Progress Notes (Signed)
   Chief Complaint  Patient presents with   foot care    Patient is here for routine foot care, states that the left foot great to has some discomfort due to possible ingrown toe nail.    Subjective: Patient presents today for evaluation of pain to the lateral border of the left great toe.  Patient does have a PMHx diabetes mellitus and requesting also a nail trim today.. Patient is concerned for possible ingrown nail to the left great toe.  It is very sensitive to touch.  He has tried routine debridement with minimal improvement and he continues to have pain and tenderness associated to the nail fold of the left hallux.  Patient presents today for further treatment and evaluation.  Past Medical History:  Diagnosis Date   Diabetes mellitus without complication (HCC)    High cholesterol    Hypertension     Objective:  General: Well developed, nourished, in no acute distress, alert and oriented x3   Dermatology: Skin is warm, dry and supple bilateral.  Lateral border left great toe is tender with evidence of an ingrowing nail. Pain on palpation noted to the border of the nail fold.  Hyperkeratotic elongated dystrophic nails also noted 1-5 bilateral  Vascular: DP and PT pulses palpable.  No clinical evidence of vascular compromise  Neruologic: Grossly intact via light touch bilateral.  Musculoskeletal: No pedal deformity noted  Assesement: #1 Paronychia with ingrowing nail lateral border left great toe  Plan of Care:  1. Patient evaluated.  2. Discussed treatment alternatives and plan of care. Explained nail avulsion procedure and post procedure course to patient. 3. Patient opted for permanent partial nail avulsion of the ingrown portion of the nail.  4. Prior to procedure, local anesthesia infiltration utilized using 3 ml of a 50:50 mixture of 2% plain lidocaine and 0.5% plain marcaine in a normal hallux block fashion and a betadine prep performed.  5. Partial permanent nail  avulsion with chemical matrixectomy performed using 3x30sec applications of phenol followed by alcohol flush.  6. Light dressing applied.  Post care instructions provided 7.  Prescription for gentamicin 2% cream  8.  Mechanical debridement of nails 1-5 bilateral was performed using a nail nipper without incident or bleeding  9.  Return to clinic 2 weeks.  Felecia Shelling, DPM [facility]  Dr. Felecia Shelling, DPM    [address] [place], [place] [address]                Office [phone]  Fax [phone]

## 2022-06-23 ENCOUNTER — Telehealth: Payer: Self-pay | Admitting: Medical

## 2022-06-23 NOTE — Telephone Encounter (Signed)
Thankfully sleep study shows no significant sleep apnea and no significant low oxygen

## 2022-06-26 NOTE — Telephone Encounter (Signed)
Pt was notified of results

## 2022-06-26 NOTE — Telephone Encounter (Signed)
Left message for pt to call me back 

## 2022-07-04 ENCOUNTER — Other Ambulatory Visit: Payer: Self-pay | Admitting: Cardiology

## 2022-07-04 DIAGNOSIS — I10 Essential (primary) hypertension: Secondary | ICD-10-CM

## 2022-07-05 ENCOUNTER — Ambulatory Visit: Payer: BC Managed Care – PPO | Admitting: Podiatry

## 2022-07-05 DIAGNOSIS — L600 Ingrowing nail: Secondary | ICD-10-CM | POA: Diagnosis not present

## 2022-07-05 NOTE — Progress Notes (Signed)
   Chief Complaint  Patient presents with   Ingrown Toenail    nail check for  ingrown left great toe, patient pleased with procedure, no pain, numbness, tingling     Subjective: 51 y.o. male presents today status post permanent nail avulsion procedure of the lateral border of the left great toe that was performed on 06/07/2022.  Patient states that he is doing well.  No longer has any pain associated to the area.  He has been applying antibiotic cream as instructed.  No new complaints at this time.   Past Medical History:  Diagnosis Date   Diabetes mellitus without complication (HCC)    High cholesterol    Hypertension     Objective: Neurovascular status intact.  Skin is warm, dry and supple. Nail and respective nail fold appears to be healing appropriately.   Assessment: #1 s/p partial permanent nail matrixectomy lateral border left great toe   Plan of care: #1 patient was evaluated  #2 light debridement of the periungual debris was performed to the border of the respective toe and nail plate using a tissue nipper. #3 patient is to return to clinic on a PRN basis.   Felecia Shelling, DPM [facility]  Dr. Felecia Shelling, DPM    [address] [place], [place] [address]                Office [phone]  Fax [phone]

## 2022-07-28 HISTORY — PX: COLONOSCOPY: SHX5424

## 2022-08-24 ENCOUNTER — Encounter: Payer: Self-pay | Admitting: Internal Medicine

## 2022-08-24 LAB — HM COLONOSCOPY

## 2022-08-25 ENCOUNTER — Encounter: Payer: Self-pay | Admitting: Medical

## 2022-09-06 ENCOUNTER — Other Ambulatory Visit: Payer: Self-pay | Admitting: Medical

## 2022-09-19 ENCOUNTER — Ambulatory Visit: Payer: BC Managed Care – PPO | Admitting: Medical

## 2022-09-19 ENCOUNTER — Encounter: Payer: Self-pay | Admitting: Medical

## 2022-09-19 ENCOUNTER — Telehealth: Payer: Self-pay | Admitting: Medical

## 2022-09-19 VITALS — BP 120/80 | HR 73 | Wt 217.8 lb

## 2022-09-19 DIAGNOSIS — I10 Essential (primary) hypertension: Secondary | ICD-10-CM | POA: Diagnosis not present

## 2022-09-19 DIAGNOSIS — Z23 Encounter for immunization: Secondary | ICD-10-CM

## 2022-09-19 DIAGNOSIS — F324 Major depressive disorder, single episode, in partial remission: Secondary | ICD-10-CM

## 2022-09-19 DIAGNOSIS — E1140 Type 2 diabetes mellitus with diabetic neuropathy, unspecified: Secondary | ICD-10-CM | POA: Diagnosis not present

## 2022-09-19 DIAGNOSIS — E1169 Type 2 diabetes mellitus with other specified complication: Secondary | ICD-10-CM | POA: Diagnosis not present

## 2022-09-19 DIAGNOSIS — Z125 Encounter for screening for malignant neoplasm of prostate: Secondary | ICD-10-CM

## 2022-09-19 DIAGNOSIS — L602 Onychogryphosis: Secondary | ICD-10-CM | POA: Insufficient documentation

## 2022-09-19 DIAGNOSIS — Z0000 Encounter for general adult medical examination without abnormal findings: Secondary | ICD-10-CM | POA: Diagnosis not present

## 2022-09-19 DIAGNOSIS — E785 Hyperlipidemia, unspecified: Secondary | ICD-10-CM

## 2022-09-19 DIAGNOSIS — R220 Localized swelling, mass and lump, head: Secondary | ICD-10-CM | POA: Insufficient documentation

## 2022-09-19 DIAGNOSIS — Z7185 Encounter for immunization safety counseling: Secondary | ICD-10-CM

## 2022-09-19 LAB — POCT URINALYSIS DIP (PROADVANTAGE DEVICE)
Bilirubin, UA: NEGATIVE
Blood, UA: NEGATIVE
Glucose, UA: NEGATIVE mg/dL
Ketones, POC UA: NEGATIVE mg/dL
Leukocytes, UA: NEGATIVE
Nitrite, UA: NEGATIVE
Protein Ur, POC: NEGATIVE mg/dL
Specific Gravity, Urine: 1.015
Urobilinogen, Ur: NEGATIVE
pH, UA: 7 (ref 5.0–8.0)

## 2022-09-19 NOTE — Progress Notes (Signed)
Subjective:   HPI  [name] is a 51 y.o. male who presents for Chief Complaint  Patient presents with   med check    Med check, went to dentist 3 months ago and had infection- going to specialist Dec 5th. Times when chewing will develop a bump inside mouth.     Patient Care Team: Lovella Hardie, Camelia Eng, PA-C as PCP - General (Family Medicine) Edrick Kins, DPM as Consulting Physician (Podiatry) Harold Hedge, Darrick Grinder, MD as Consulting Physician (Allergy and Immunology) Nigel Mormon, MD as Consulting Physician (Cardiology) Sees dentist Sees eye doctor  Concerns: Had flu shot recently at allergist office  Had to stop ozempic temporarily for colonoscopy in 07/2022  Has lump in left cheek, seeing oral surgeon soon for this  Compliant with other medications as usual   Reviewed their medical, surgical, family, social, medication, and allergy history and updated chart as appropriate.  Past Medical History:  Diagnosis Date   Diabetes mellitus without complication (HCC)    High cholesterol    Hypertension     Past Surgical History:  Procedure Laterality Date   BACK SURGERY     COLONOSCOPY  07/2022   Dr. Carol Ada   KNEE SURGERY Left    arthroscopic   SHOULDER OPEN ROTATOR CUFF REPAIR Right     Family History  Problem Relation Age of Onset   Alzheimer's disease Mother    Stroke Mother    Stroke Father    Heart disease Father 44       died of MI   Hypertension Father    Hyperlipidemia Father    Diabetes Father    Diabetes Sister    Diabetes Brother    Cancer Neg Hx      Current Outpatient Medications:    amLODipine (NORVASC) 10 MG tablet, TAKE 1 TABLET BY MOUTH EVERY DAY, Disp: 90 tablet, Rfl: 0   carvedilol (COREG) 6.25 MG tablet, TAKE 1 TABLET BY MOUTH TWICE A DAY, Disp: 180 tablet, Rfl: 1   metFORMIN (GLUCOPHAGE) 1000 MG tablet, TAKE 1 TABLET (1,000 MG TOTAL) BY MOUTH 2 (TWO) TIMES DAILY WITH A MEAL., Disp: 180 tablet, Rfl: 1   Semaglutide,  2 MG/DOSE, 8 MG/3ML SOPN, Inject 2 mg as directed once a week., Disp: 3 mL, Rfl: 5   valsartan-hydrochlorothiazide (DIOVAN-HCT) 320-12.5 MG tablet, Take 1 tablet by mouth daily., Disp: 90 tablet, Rfl: 3   rosuvastatin (CRESTOR) 10 MG tablet, Take 1 tablet (10 mg total) by mouth daily., Disp: 90 tablet, Rfl: 3   VASCEPA 1 g capsule, TAKE 2 CAPSULES BY MOUTH TWICE A DAY (Patient not taking: Reported on 09/19/2022), Disp: 120 capsule, Rfl: 0  Allergies  Allergen Reactions   Morphine And Related Other (See Comments)    CAUSES SEVERE PRESSURE (FEELING) IN HIS HEAD     Review of Systems Constitutional: -fever, -chills, -sweats, -unexpected weight change, -decreased appetite, -fatigue Allergy: -sneezing, -itching, -congestion Dermatology: -changing moles, --rash, -lumps ENT: -runny nose, -ear pain, -sore throat, -hoarseness, -sinus pain, -teeth pain, - ringing in ears, -hearing loss, -nosebleeds Cardiology: -chest pain, -palpitations, -swelling, -difficulty breathing when lying flat, -waking up short of breath Respiratory: -cough, -shortness of breath, -difficulty breathing with exercise or exertion, -wheezing, -coughing up blood Gastroenterology: -abdominal pain, -nausea, -vomiting, -diarrhea, -constipation, -blood in stool, -changes in bowel movement, -difficulty swallowing or eating Hematology: -bleeding, -bruising  Musculoskeletal: -joint aches, -muscle aches, -joint swelling, -back pain, -neck pain, -cramping, -changes in gait Ophthalmology: denies vision changes, eye  redness, itching, discharge Urology: -burning with urination, -difficulty urinating, -blood in urine, -urinary frequency, -urgency, -incontinence Neurology: -headache, -weakness, -tingling, -numbness, -memory loss, -falls, -dizziness Psychology: -depressed mood, -agitation, -sleep problems Male GU: no testicular mass, pain, no lymph nodes swollen, no swelling, no rash.     09/19/2022   10:32 AM 05/08/2022    3:50 PM  03/22/2022    9:38 AM 11/08/2021   10:18 AM 08/09/2021   10:35 AM  Depression screen PHQ 2/9  Decreased Interest 0 0 0 0 0  Down, Depressed, Hopeless 0 0 0 0 0  PHQ - 2 Score 0 0 0 0 0  Altered sleeping  0  0 0  Tired, decreased energy  0  0 0  Change in appetite  0  0 0  Feeling bad or failure about yourself   0  0 0  Trouble concentrating  0  0 0  Moving slowly or fidgety/restless  0  0 0  Suicidal thoughts  0  0 0  PHQ-9 Score  0  0 0  Difficult doing work/chores  Not difficult at all  Not difficult at all Not difficult at all        Objective:  BP 120/80   Pulse 73   Wt 217 lb 12.8 oz (98.8 kg)   BMI 35.15 kg/m   General appearance: alert, no distress, WD/WN, Hispanic male Skin: unremarkable HEENT: normocephalic, conjunctiva/corneas normal, sclerae anicteric, PERRLA, EOMi, nares patent, no discharge or erythema, pharynx normal Oral cavity: fleshy prominence inside left cheek at opening to parotid gland, otherwise MMM, tongue normal, teeth normal Neck: supple, no lymphadenopathy, no thyromegaly, no masses, normal ROM, no bruits Chest: non tender, normal shape and expansion Heart: RRR, normal S1, S2, no murmurs Lungs: CTA bilaterally, no wheezes, rhonchi, or rales Abdomen: +bs, soft, non tender, non distended, no masses, no hepatomegaly, no splenomegaly, no bruits Back: non tender, normal ROM, no scoliosis Musculoskeletal: upper extremities non tender, no obvious deformity, normal ROM throughout, lower extremities non tender, no obvious deformity, normal ROM throughout Extremities: no edema, no cyanosis, no clubbing Pulses: 2+ symmetric, upper and lower extremities, normal cap refill Neurological: alert, oriented x 3, CN2-12 intact, strength normal upper extremities and lower extremities, sensation normal throughout, DTRs 2+ throughout, no cerebellar signs, gait normal Psychiatric: normal affect, behavior normal, pleasant  GU: deferred/declined Rectal:  declined   Assessment and Plan :   Encounter Diagnoses  Name Primary?   Encounter for health maintenance examination in adult Yes   Type 2 diabetes mellitus with diabetic neuropathy, unspecified whether long term insulin use ([place])    Hyperlipidemia associated with type 2 diabetes mellitus ([place])    Primary hypertension    Vaccine counseling    Depression, major, single episode, in partial remission ([place])    Need for COVID-19 vaccine    Screening for prostate cancer    Hypertrophic toenail    Buccal mass     This visit was a preventative care visit, also known as wellness visit or routine physical.   Topics typically include healthy lifestyle, diet, exercise, preventative care, vaccinations, sick and well care, proper use of emergency dept and after hours care, as well as other concerns.     Recommendations: Continue to return yearly for your annual wellness and preventative care visits.  This gives [place] a chance to discuss healthy lifestyle, exercise, vaccinations, review your chart record, and perform screenings where appropriate.  I recommend you see your eye doctor yearly for routine vision  care.  I recommend you see your dentist yearly for routine dental care including hygiene visits twice yearly.   Vaccination recommendations were reviewed Immunization History  Administered Date(s) Administered   COVID-19, mRNA, vaccine(Comirnaty)12 years and older 09/19/2022   Influenza, High Dose Seasonal PF 09/24/2020, 09/16/2021   Influenza,inj,Quad PF,6+ Mos 08/05/2020, 08/09/2021   PFIZER(Purple Top)SARS-COV-2 Vaccination 06/18/2020, 09/24/2020, 11/16/2020   Pfizer Covid-19 Vaccine Bivalent Booster 11yrs & up 11/08/2021   Pneumococcal Polysaccharide-23 08/09/2021   Td 08/05/2020    Counseled on the Covid virus vaccine.  Vaccine information sheet given.  Covid vaccine given after consent obtained.  Return soon for Shingrix vaccine    Screening for cancer: Colon cancer  screening: I reviewed your colonoscopy on file that is up to date from 07/2022  We discussed PSA, prostate exam, and prostate cancer screening risks/benefits.     Skin cancer screening: Check your skin regularly for new changes, growing lesions, or other lesions of concern Come in for evaluation if you have skin lesions of concern.  Lung cancer screening: If you have a greater than 20 pack year history of tobacco use, then you may qualify for lung cancer screening with a chest CT scan.   Please call your insurance company to inquire about coverage for this test.  We currently don't have screenings for other cancers besides breast, cervical, colon, and lung cancers.  If you have a strong family history of cancer or have other cancer screening concerns, please let me know.    Bone health: Get at least 150 minutes of aerobic exercise weekly Get weight bearing exercise at least once weekly Bone density test:  A bone density test is an imaging test that uses a type of X-ray to measure the amount of calcium and other minerals in your bones. The test may be used to diagnose or screen you for a condition that causes weak or thin bones (osteoporosis), predict your risk for a broken bone (fracture), or determine how well your osteoporosis treatment is working. The bone density test is recommended for females 26 and older, or females or males <88 if certain risk factors such as thyroid disease, long term use of steroids such as for asthma or rheumatological issues, vitamin D deficiency, estrogen deficiency, family history of osteoporosis, self or family history of fragility fracture in first degree relative.    Heart health: Get at least 150 minutes of aerobic exercise weekly Limit alcohol It is important to maintain a healthy blood pressure and healthy cholesterol numbers  Heart disease screening: Screening for heart disease includes screening for blood pressure, fasting lipids, glucose/diabetes  screening, BMI height to weight ratio, reviewed of smoking status, physical activity, and diet.    Goals include blood pressure 120/80 or less, maintaining a healthy lipid/cholesterol profile, preventing diabetes or keeping diabetes numbers under good control, not smoking or using tobacco products, exercising most days per week or at least 150 minutes per week of exercise, and eating healthy variety of fruits and vegetables, healthy oils, and avoiding unhealthy food choices like fried food, fast food, high sugar and high cholesterol foods.     Medical care options: I recommend you continue to seek care here first for routine care.  We try really hard to have available appointments Monday through Friday daytime hours for sick visits, acute visits, and physicals.  Urgent care should be used for after hours and weekends for significant issues that cannot wait till the next day.  The emergency department should be used for  significant potentially life-threatening emergencies.  The emergency department is expensive, can often have long wait times for less significant concerns, so try to utilize primary care, urgent care, or telemedicine when possible to avoid unnecessary trips to the emergency department.  Virtual visits and telemedicine have been introduced since the pandemic started in 2020, and can be convenient ways to receive medical care.  We offer virtual appointments as well to assist you in a variety of options to seek medical care.   Advanced Directives: I recommend you consider completing a [place] and Living Will.   These documents respect your wishes and help alleviate burdens on your loved ones if you were to become terminally ill or be in a position to need those documents enforced.    You can complete Advanced Directives yourself, have them notarized, then have copies made for our office, for you and for anybody you feel should have them in safe keeping.  Or, you can  have an attorney prepare these documents.   If you haven't updated your Last Will and Testament in a while, it may be worthwhile having an attorney prepare these documents together and save on some costs.       Separate significant issues discussed: High blood pressure-continue current medications, amlodipine 10 mg daily, carvedilol 6.25 mg twice daily, valsartan HCT 320/12.5 mg daily  High cholesterol-continue Crestor rosuvastatin.  He noted that you had been out of Vascepa fish oil.  We will check labs today  Diabetes-continue metformin 1000 mg twice daily and Ozempic injection weekly.  Routine labs today.  See your eye doctor yearly for diabetic eye exam.  Check your feet daily for sores or wounds.  Follow-up with podiatry about your toenails   Buccal mass, left inside cheek - he is following up with oral surgeon soon about this and [place] was seen today for med check.  Diagnoses and all orders for this visit:  Encounter for health maintenance examination in adult -     Hemoglobin A1c -     Comprehensive metabolic panel -     CBC -     PSA -     Microalbumin/Creatinine Ratio, Urine -     POCT Urinalysis DIP (Proadvantage Device) -     Lipid panel  Type 2 diabetes mellitus with diabetic neuropathy, unspecified whether long term insulin use (HCC) -     Hemoglobin A1c -     Microalbumin/Creatinine Ratio, Urine  Hyperlipidemia associated with type 2 diabetes mellitus ([place]) -     Lipid panel  Primary hypertension  Vaccine counseling  Depression, major, single episode, in partial remission [facility])  Need for COVID-19 vaccine [employer] Fall 2023 Covid-19 Vaccine 53yrs and older  Screening for prostate cancer -     PSA  Hypertrophic toenail  Buccal mass   Follow-up pending labs, yearly for physical

## 2022-09-19 NOTE — Telephone Encounter (Signed)
Please call his dental office listed below.  He has a fleshy lesion inside his left cheek that he wants looked at which should be seen by an oral surgeon.  He says his dentist referred him to somebody about his tooth issue or teeth infection issue.  Their website shows an oral surgeon in-house.  He wanted me to call them to see about making sure he sees an oral surgeon  Dr. Wendall Papa,   [facility] [phone]

## 2022-09-19 NOTE — Patient Instructions (Signed)
This visit was a preventative care visit, also known as wellness visit or routine physical.   Topics typically include healthy lifestyle, diet, exercise, preventative care, vaccinations, sick and well care, proper use of emergency dept and after hours care, as well as other concerns.     Recommendations: Continue to return yearly for your annual wellness and preventative care visits.  This gives [place] a chance to discuss healthy lifestyle, exercise, vaccinations, review your chart record, and perform screenings where appropriate.  I recommend you see your eye doctor yearly for routine vision care.  I recommend you see your dentist yearly for routine dental care including hygiene visits twice yearly.   Vaccination recommendations were reviewed Immunization History  Administered Date(s) Administered   COVID-19, mRNA, vaccine(Comirnaty)12 years and older 09/19/2022   Influenza, High Dose Seasonal PF 09/24/2020, 09/16/2021   Influenza,inj,Quad PF,6+ Mos 08/05/2020, 08/09/2021   PFIZER(Purple Top)SARS-COV-2 Vaccination 06/18/2020, 09/24/2020, 11/16/2020   Pfizer Covid-19 Vaccine Bivalent Booster 18yrs & up 11/08/2021   Pneumococcal Polysaccharide-23 08/09/2021   Td 08/05/2020    Counseled on the Covid virus vaccine.  Vaccine information sheet given.  Covid vaccine given after consent obtained.  Return soon for Shingrix vaccine    Screening for cancer: Colon cancer screening: I reviewed your colonoscopy on file that is up to date from 07/2022  We discussed PSA, prostate exam, and prostate cancer screening risks/benefits.     Skin cancer screening: Check your skin regularly for new changes, growing lesions, or other lesions of concern Come in for evaluation if you have skin lesions of concern.  Lung cancer screening: If you have a greater than 20 pack year history of tobacco use, then you may qualify for lung cancer screening with a chest CT scan.   Please call your insurance company  to inquire about coverage for this test.  We currently don't have screenings for other cancers besides breast, cervical, colon, and lung cancers.  If you have a strong family history of cancer or have other cancer screening concerns, please let me know.    Bone health: Get at least 150 minutes of aerobic exercise weekly Get weight bearing exercise at least once weekly Bone density test:  A bone density test is an imaging test that uses a type of X-ray to measure the amount of calcium and other minerals in your bones. The test may be used to diagnose or screen you for a condition that causes weak or thin bones (osteoporosis), predict your risk for a broken bone (fracture), or determine how well your osteoporosis treatment is working. The bone density test is recommended for females 71 and older, or females or males <40 if certain risk factors such as thyroid disease, long term use of steroids such as for asthma or rheumatological issues, vitamin D deficiency, estrogen deficiency, family history of osteoporosis, self or family history of fragility fracture in first degree relative.    Heart health: Get at least 150 minutes of aerobic exercise weekly Limit alcohol It is important to maintain a healthy blood pressure and healthy cholesterol numbers  Heart disease screening: Screening for heart disease includes screening for blood pressure, fasting lipids, glucose/diabetes screening, BMI height to weight ratio, reviewed of smoking status, physical activity, and diet.    Goals include blood pressure 120/80 or less, maintaining a healthy lipid/cholesterol profile, preventing diabetes or keeping diabetes numbers under good control, not smoking or using tobacco products, exercising most days per week or at least 150 minutes per week of exercise, and  eating healthy variety of fruits and vegetables, healthy oils, and avoiding unhealthy food choices like fried food, fast food, high sugar and high  cholesterol foods.     Medical care options: I recommend you continue to seek care here first for routine care.  We try really hard to have available appointments Monday through Friday daytime hours for sick visits, acute visits, and physicals.  Urgent care should be used for after hours and weekends for significant issues that cannot wait till the next day.  The emergency department should be used for significant potentially life-threatening emergencies.  The emergency department is expensive, can often have long wait times for less significant concerns, so try to utilize primary care, urgent care, or telemedicine when possible to avoid unnecessary trips to the emergency department.  Virtual visits and telemedicine have been introduced since the pandemic started in 2020, and can be convenient ways to receive medical care.  We offer virtual appointments as well to assist you in a variety of options to seek medical care.   Advanced Directives: I recommend you consider completing a [place] and Living Will.   These documents respect your wishes and help alleviate burdens on your loved ones if you were to become terminally ill or be in a position to need those documents enforced.    You can complete Advanced Directives yourself, have them notarized, then have copies made for our office, for you and for anybody you feel should have them in safe keeping.  Or, you can have an attorney prepare these documents.   If you haven't updated your Last Will and Testament in a while, it may be worthwhile having an attorney prepare these documents together and save on some costs.       Separate significant issues discussed: High blood pressure-continue current medications, amlodipine 10 mg daily, carvedilol 6.25 mg twice daily, valsartan HCT 320/12.5 mg daily  High cholesterol-continue Crestor rosuvastatin.  He noted that you had been out of Vascepa fish oil.  We will check labs  today  Diabetes-continue metformin 1000 mg twice daily and Ozempic injection weekly.  Routine labs today.  See your eye doctor yearly for diabetic eye exam.  Check your feet daily for sores or wounds.  Follow-up with podiatry about your toenails

## 2022-09-19 NOTE — Telephone Encounter (Signed)
Pt is scheduled for Tuesday December 5th at 12pm with oral surgeron at his dentist office. Pt will be called about it from there office.

## 2022-09-20 ENCOUNTER — Other Ambulatory Visit: Payer: Self-pay | Admitting: Internal Medicine

## 2022-09-20 ENCOUNTER — Other Ambulatory Visit: Payer: Self-pay | Admitting: Medical

## 2022-09-20 DIAGNOSIS — I10 Essential (primary) hypertension: Secondary | ICD-10-CM

## 2022-09-20 DIAGNOSIS — R748 Abnormal levels of other serum enzymes: Secondary | ICD-10-CM

## 2022-09-20 MED ORDER — SEMAGLUTIDE (2 MG/DOSE) 8 MG/3ML ~~LOC~~ SOPN: 2.0000 mg | PEN_INJECTOR | SUBCUTANEOUS | 5 refills | Status: DC

## 2022-09-20 MED ORDER — VALSARTAN-HYDROCHLOROTHIAZIDE 320-12.5 MG PO TABS: 1.0000 | ORAL_TABLET | Freq: Every day | ORAL | 3 refills | Status: DC

## 2022-09-20 MED ORDER — METFORMIN HCL 1000 MG PO TABS: 1000.0000 mg | ORAL_TABLET | Freq: Two times a day (BID) | ORAL | 3 refills | Status: DC

## 2022-09-20 MED ORDER — ICOSAPENT ETHYL 1 G PO CAPS: 2.0000 g | ORAL_CAPSULE | Freq: Two times a day (BID) | ORAL | 3 refills | Status: DC

## 2022-09-20 MED ORDER — AMLODIPINE BESYLATE 10 MG PO TABS: 10.0000 mg | ORAL_TABLET | Freq: Every day | ORAL | 3 refills | Status: DC

## 2022-09-20 MED ORDER — CARVEDILOL 6.25 MG PO TABS: 6.2500 mg | ORAL_TABLET | Freq: Two times a day (BID) | ORAL | 3 refills | Status: DC

## 2022-09-20 MED ORDER — ROSUVASTATIN CALCIUM 10 MG PO TABS: 10.0000 mg | ORAL_TABLET | Freq: Every day | ORAL | 3 refills | Status: DC

## 2022-09-21 LAB — LIPID PANEL
Chol/HDL Ratio: 3.2 ratio (ref 0.0–5.0)
Cholesterol, Total: 117 mg/dL (ref 100–199)
HDL: 37 mg/dL — ABNORMAL LOW (ref >39)
LDL Chol Calc (NIH): 53 mg/dL (ref 0–99)
Triglycerides: 155 mg/dL — ABNORMAL HIGH (ref 0–149)
VLDL Cholesterol Cal: 27 mg/dL (ref 5–40)

## 2022-09-21 LAB — COMPREHENSIVE METABOLIC PANEL
ALT: 47 IU/L — ABNORMAL HIGH (ref 0–44)
AST: 45 IU/L — ABNORMAL HIGH (ref 0–40)
Albumin/Globulin Ratio: 2.2 (ref 1.2–2.2)
Albumin: 5 g/dL — ABNORMAL HIGH (ref 3.8–4.9)
Alkaline Phosphatase: 76 IU/L (ref 44–121)
BUN/Creatinine Ratio: 12 (ref 9–20)
BUN: 11 mg/dL (ref 6–24)
Bilirubin Total: <0.2 mg/dL (ref 0.0–1.2)
CO2: 23 mmol/L (ref 20–29)
Calcium: 9.8 mg/dL (ref 8.7–10.2)
Chloride: 97 mmol/L (ref 96–106)
Creatinine, Ser: 0.91 mg/dL (ref 0.76–1.27)
Globulin, Total: 2.3 g/dL (ref 1.5–4.5)
Glucose: 224 mg/dL — ABNORMAL HIGH (ref 70–99)
Potassium: 3.5 mmol/L (ref 3.5–5.2)
Sodium: 141 mmol/L (ref 134–144)
Total Protein: 7.3 g/dL (ref 6.0–8.5)
eGFR: 102 mL/min/{1.73_m2} (ref >59)

## 2022-09-21 LAB — CBC
Hematocrit: 42.1 % (ref 37.5–51.0)
Hemoglobin: 13.6 g/dL (ref 13.0–17.7)
MCH: 25.1 pg — ABNORMAL LOW (ref 26.6–33.0)
MCHC: 32.3 g/dL (ref 31.5–35.7)
MCV: 78 fL — ABNORMAL LOW (ref 79–97)
Platelets: 316 10*3/uL (ref 150–450)
RBC: 5.42 x10E6/uL (ref 4.14–5.80)
RDW: 14.5 % (ref 11.6–15.4)
WBC: 8.7 10*3/uL (ref 3.4–10.8)

## 2022-09-21 LAB — HEMOGLOBIN A1C
Est. average glucose Bld gHb Est-mCnc: 166 mg/dL
Hgb A1c MFr Bld: 7.4 % — ABNORMAL HIGH (ref 4.8–5.6)

## 2022-09-21 LAB — SPECIMEN STATUS REPORT

## 2022-09-21 LAB — PSA: Prostate Specific Ag, Serum: 0.4 ng/mL (ref 0.0–4.0)

## 2022-09-21 LAB — IRON: Iron: 42 ug/dL (ref 38–169)

## 2022-09-21 LAB — MICROALBUMIN / CREATININE URINE RATIO
Creatinine, Urine: 141.5 mg/dL
Microalb/Creat Ratio: 29 mg/g creat (ref 0–29)
Microalbumin, Urine: 40.7 ug/mL

## 2022-10-03 ENCOUNTER — Other Ambulatory Visit: Payer: BC Managed Care – PPO

## 2022-10-04 ENCOUNTER — Ambulatory Visit
Admission: RE | Admit: 2022-10-04 | Discharge: 2022-10-04 | Disposition: A | Discharge status: Home / Self Care | Payer: BC Managed Care – PPO | Source: Ambulatory Visit | Attending: Medical | Admitting: Medical

## 2022-10-04 DIAGNOSIS — R748 Abnormal levels of other serum enzymes: Secondary | ICD-10-CM

## 2022-11-23 ENCOUNTER — Telehealth: Payer: Self-pay | Admitting: Medical

## 2022-11-23 ENCOUNTER — Other Ambulatory Visit: Payer: Self-pay

## 2022-11-23 MED ORDER — SEMAGLUTIDE (2 MG/DOSE) 8 MG/3ML ~~LOC~~ SOPN: 2.0000 mg | PEN_INJECTOR | SUBCUTANEOUS | 5 refills | Status: DC

## 2022-11-23 NOTE — Telephone Encounter (Signed)
Pt called he states [facility] has Ozempic in stock, can we send rx there

## 2023-01-02 ENCOUNTER — Other Ambulatory Visit: Payer: Self-pay | Admitting: Medical

## 2023-01-02 ENCOUNTER — Telehealth: Payer: Self-pay | Admitting: Medical

## 2023-01-02 MED ORDER — SEMAGLUTIDE (2 MG/DOSE) 8 MG/3ML ~~LOC~~ SOPN: 2.0000 mg | PEN_INJECTOR | SUBCUTANEOUS | 2 refills | Status: DC

## 2023-01-02 NOTE — Telephone Encounter (Signed)
Pt called requesting refill on his ozempic [facility]   [facility] [address], [place], [place] [address]

## 2023-05-24 ENCOUNTER — Telehealth: Payer: Self-pay | Admitting: Family Medicine

## 2023-05-24 NOTE — Telephone Encounter (Signed)
Pt wants to know when can he have his shingles shot?  Pt ph [phone]

## 2023-05-25 NOTE — Telephone Encounter (Signed)
Pt was notified and will wait until August

## 2023-05-25 NOTE — Telephone Encounter (Signed)
Pt is sched 8/27 for physical

## 2023-07-24 ENCOUNTER — Encounter: Payer: Self-pay | Admitting: Medical

## 2023-07-24 ENCOUNTER — Ambulatory Visit: Payer: BC Managed Care – PPO | Admitting: Medical

## 2023-07-24 VITALS — BP 130/80 | HR 82 | Ht 67.0 in | Wt 217.6 lb

## 2023-07-24 DIAGNOSIS — Z23 Encounter for immunization: Secondary | ICD-10-CM | POA: Diagnosis not present

## 2023-07-24 DIAGNOSIS — L989 Disorder of the skin and subcutaneous tissue, unspecified: Secondary | ICD-10-CM

## 2023-07-24 DIAGNOSIS — E1169 Type 2 diabetes mellitus with other specified complication: Secondary | ICD-10-CM

## 2023-07-24 DIAGNOSIS — R011 Cardiac murmur, unspecified: Secondary | ICD-10-CM

## 2023-07-24 DIAGNOSIS — E1140 Type 2 diabetes mellitus with diabetic neuropathy, unspecified: Secondary | ICD-10-CM

## 2023-07-24 DIAGNOSIS — Z7185 Encounter for immunization safety counseling: Secondary | ICD-10-CM | POA: Diagnosis not present

## 2023-07-24 DIAGNOSIS — E785 Hyperlipidemia, unspecified: Secondary | ICD-10-CM

## 2023-07-24 DIAGNOSIS — L602 Onychogryphosis: Secondary | ICD-10-CM

## 2023-07-24 DIAGNOSIS — I10 Essential (primary) hypertension: Secondary | ICD-10-CM

## 2023-07-24 NOTE — Patient Instructions (Signed)
Diabetes Continue ozempic and metformin Continue glucose monitoring See your eye doctor soon and make sure they send [place] copy of the eye report  Hypertension Continue Amlodipine and Carvedilol and Valsartan HCT  Hyperlipoidemia  Continue Vascepa and Crestor  Skin lesion - referral placed to dermatology  Counseled on the influenza virus vaccine.  Vaccine information sheet given.  Influenza vaccine given after consent obtained.  Counseled on the Shingrix vaccine.  Vaccine information sheet given. Shingrix #1 vaccine given after consent obtained.   Return in 2 months for Shingrix #2.  Heart murmur-reviewed 2021 echocardiogram report

## 2023-07-24 NOTE — Progress Notes (Signed)
Subjective:  [name] is a 52 y.o. male who presents for Chief Complaint  Patient presents with   Diabetes     Here for med check.  Hypertension-compliant with amlodipine 10 mg daily, carvedilol 6.25 mg twice daily, valsartan HCT 320/12.5 mg daily  Hyperlipidemia-compliant with Crestor 10 mg daily and Vascepa fish oil  Diabetes-compliant with Ozempic 2 mg weekly and metformin 1000 mg twice daily.  Glucose has been ranging 140-150 fasting.  No foot lesions.  He is past due for eye doctor visit.  Last ophthalmology visit 2 years ago.  Sees [facility].  No other issues.  No other aggravating or relieving factors.    No other c/o.  Past Medical History:  Diagnosis Date   Diabetes mellitus without complication (HCC)    High cholesterol    Hypertension    Current Outpatient Medications on File Prior to Visit  Medication Sig Dispense Refill   amLODipine (NORVASC) 10 MG tablet Take 1 tablet (10 mg total) by mouth daily. 90 tablet 3   carvedilol (COREG) 6.25 MG tablet Take 1 tablet (6.25 mg total) by mouth 2 (two) times daily. 180 tablet 3   icosapent Ethyl (VASCEPA) 1 g capsule Take 2 capsules (2 g total) by mouth 2 (two) times daily. 360 capsule 3   metFORMIN (GLUCOPHAGE) 1000 MG tablet Take 1 tablet (1,000 mg total) by mouth 2 (two) times daily with a meal. 180 tablet 3   rosuvastatin (CRESTOR) 10 MG tablet Take 1 tablet (10 mg total) by mouth daily. 90 tablet 3   Semaglutide, 2 MG/DOSE, 8 MG/3ML SOPN Inject 2 mg as directed once a week. 3 mL 2   valsartan-hydrochlorothiazide (DIOVAN-HCT) 320-12.5 MG tablet Take 1 tablet by mouth daily. 90 tablet 3   No current facility-administered medications on file prior to visit.    The following portions of the patient's history were reviewed and updated as appropriate: allergies, current medications, past family history, past medical history, past social history, past surgical history and problem list.  ROS Otherwise as in  subjective above  Objective: BP 130/80   Pulse 82   Ht 5\' 7"  (1.702 m)   Wt 217 lb 9.6 oz (98.7 kg)   BMI 34.08 kg/m   General appearance: alert, no distress, well developed, well nourished Round skin lesion just lateral to the right lateral eye orbit Neck: supple, no lymphadenopathy, no thyromegaly, no masses Heart: 2/6 brief holosystolic murmur heard in upper sternal borders, otherwise RRR, normal S1, S2 Lungs: CTA bilaterally, no wheezes, rhonchi, or rales Abdomen: +bs, soft, non tender, non distended, no masses, no hepatomegaly, no splenomegaly Pulses: 2+ radial pulses, 2+ pedal pulses, normal cap refill Ext: no edema  Diabetic Foot Exam - Simple   No data filed      Echocardiogram 08/30/2020: Left ventricle cavity is normal in size. Moderate concentric hypertrophy of the left ventricle. Normal global wall motion. Normal LV systolic function with EF 55%. Doppler evidence of grade I (impaired) diastolic dysfunction, normal LAP. Left atrial cavity is mildly dilated. No significant valvular abnormality. Estimated RA pressure 8 mmHg.     Assessment: Encounter Diagnoses  Name Primary?   Hyperlipidemia associated with type 2 diabetes mellitus (HCC) Yes   Primary hypertension    Type 2 diabetes mellitus with diabetic neuropathy, unspecified whether long term insulin use (HCC)    Vaccine counseling    Hypertrophic toenail    Skin lesion      Plan: Diabetes Continue ozempic and metformin  Continue glucose monitoring See your eye doctor soon and make sure they send [place] copy of the eye report  Hypertension Continue Amlodipine and Carvedilol and Valsartan HCT  Hyperlipoidemia  Continue Vascepa and Crestor  Skin lesion - referral placed to dermatology  Counseled on the influenza virus vaccine.  Vaccine information sheet given.  Influenza vaccine given after consent obtained.  Counseled on the Shingrix vaccine.  Vaccine information sheet given. Shingrix #1 vaccine  given after consent obtained.   Return in 2 months for Shingrix #2.  Heart murmur-reviewed 2021 echocardiogram report   Tyreese was seen today for diabetes.  Diagnoses and all orders for this visit:  Hyperlipidemia associated with type 2 diabetes mellitus (HCC)  Primary hypertension  Type 2 diabetes mellitus with diabetic neuropathy, unspecified whether long term insulin use (HCC) -     Comprehensive metabolic panel -     Hemoglobin A1c  Vaccine counseling  Hypertrophic toenail  Skin lesion -     Ambulatory referral to Dermatology    Follow up: pending labs

## 2023-07-24 NOTE — Addendum Note (Signed)
Addended by: Herminio Commons A on: 07/24/2023 03:29 PM   Modules accepted: Orders

## 2023-07-25 LAB — COMPREHENSIVE METABOLIC PANEL
ALT: 55 IU/L — ABNORMAL HIGH (ref 0–44)
AST: 60 IU/L — ABNORMAL HIGH (ref 0–40)
Albumin: 4.8 g/dL (ref 3.8–4.9)
Alkaline Phosphatase: 72 IU/L (ref 44–121)
BUN/Creatinine Ratio: 18 (ref 9–20)
BUN: 14 mg/dL (ref 6–24)
Bilirubin Total: 0.3 mg/dL (ref 0.0–1.2)
CO2: 25 mmol/L (ref 20–29)
Calcium: 9 mg/dL (ref 8.7–10.2)
Chloride: 98 mmol/L (ref 96–106)
Creatinine, Ser: 0.8 mg/dL (ref 0.76–1.27)
Globulin, Total: 2.3 g/dL (ref 1.5–4.5)
Glucose: 275 mg/dL — ABNORMAL HIGH (ref 70–99)
Potassium: 3.7 mmol/L (ref 3.5–5.2)
Sodium: 141 mmol/L (ref 134–144)
Total Protein: 7.1 g/dL (ref 6.0–8.5)
eGFR: 106 mL/min/{1.73_m2} (ref >59)

## 2023-07-25 LAB — HEMOGLOBIN A1C
Est. average glucose Bld gHb Est-mCnc: 212 mg/dL
Hgb A1c MFr Bld: 9 % — ABNORMAL HIGH (ref 4.8–5.6)

## 2023-07-25 NOTE — Progress Notes (Signed)
Blood sugar is too high, kidney and electrolytes normal.  Liver tests are elevated as in the past.  A1c too high at 9.0%  I recommend switching to [facility] if we can get insurance to cover this.  I think it may work little bit better than the Ozempic.  Continue metformin.  Continue rest of medicines as usual  In addition to [facility], I recommend adding something else to help lower sugars as well such as Jardiance once daily tablet assuming this is covered by insurance.  [place] can make you urinate a little bit more than usual but I think it could be helpful in getting the sugars to goal along with [facility]  Work on a strict diet and regular exercise  Sabrina-see if agreeable to these changes, Mounjaro and adding [place]

## 2023-07-31 ENCOUNTER — Other Ambulatory Visit: Payer: Self-pay | Admitting: Medical

## 2023-07-31 MED ORDER — MOUNJARO 10 MG/0.5ML ~~LOC~~ SOAJ: 10.0000 mg | SUBCUTANEOUS | 1 refills | Status: DC

## 2023-07-31 MED ORDER — EMPAGLIFLOZIN 10 MG PO TABS: 10.0000 mg | ORAL_TABLET | Freq: Every day | ORAL | 1 refills | Status: DC

## 2023-08-01 ENCOUNTER — Telehealth: Payer: Self-pay

## 2023-08-01 NOTE — Telephone Encounter (Signed)
Key: [facility] PA Case ID #: [id number] Rx #: [id number] Status: sent iconSent to Plan today Drug: Mounjaro 10MG /0.5ML pen-injectors Form: Ambulance person PA Form [phone] NCPDP)

## 2023-08-03 NOTE — Telephone Encounter (Signed)
Left pt voicemail to call back. Approval faxed to pharmacy on [address].

## 2023-08-03 NOTE — Telephone Encounter (Signed)
Key: [facility] PA Case ID #: [id number] Rx #: [id number] Outcome: Approved on September 4 by [employer] NCPDP 2017  Your PA request has been approved. Additional information will be provided in the approval communication. (Message 1145)  Authorization Expiration Date: 07/30/2026 Drug: Greggory Keen 10MG /0.5ML pen-injectors Form: Ambulance person PA Form [phone] NCPDP)

## 2023-09-25 ENCOUNTER — Other Ambulatory Visit (INDEPENDENT_AMBULATORY_CARE_PROVIDER_SITE_OTHER): Payer: BC Managed Care – PPO

## 2023-09-25 DIAGNOSIS — Z23 Encounter for immunization: Secondary | ICD-10-CM

## 2023-09-26 ENCOUNTER — Telehealth: Payer: Self-pay | Admitting: Medical

## 2023-09-26 NOTE — Telephone Encounter (Signed)
Pt wants recommendation for an eye doctor

## 2023-09-27 NOTE — Telephone Encounter (Signed)
Pt was given [facility] information

## 2023-10-07 ENCOUNTER — Other Ambulatory Visit: Payer: Self-pay | Admitting: Medical

## 2023-10-09 NOTE — Telephone Encounter (Signed)
Has upcoming appointment

## 2023-10-17 ENCOUNTER — Other Ambulatory Visit: Payer: Self-pay | Admitting: Medical

## 2023-10-23 ENCOUNTER — Other Ambulatory Visit: Payer: Self-pay | Admitting: Medical

## 2023-10-23 ENCOUNTER — Ambulatory Visit: Payer: BC Managed Care – PPO | Admitting: Medical

## 2023-11-07 ENCOUNTER — Other Ambulatory Visit: Payer: Self-pay | Admitting: Medical

## 2023-11-16 ENCOUNTER — Ambulatory Visit: Payer: BC Managed Care – PPO | Admitting: Medical

## 2023-11-16 VITALS — BP 130/80 | HR 64 | Wt 216.8 lb

## 2023-11-16 DIAGNOSIS — E785 Hyperlipidemia, unspecified: Secondary | ICD-10-CM

## 2023-11-16 DIAGNOSIS — E1169 Type 2 diabetes mellitus with other specified complication: Secondary | ICD-10-CM | POA: Diagnosis not present

## 2023-11-16 DIAGNOSIS — I10 Essential (primary) hypertension: Secondary | ICD-10-CM

## 2023-11-16 DIAGNOSIS — E1140 Type 2 diabetes mellitus with diabetic neuropathy, unspecified: Secondary | ICD-10-CM

## 2023-11-16 DIAGNOSIS — Z125 Encounter for screening for malignant neoplasm of prostate: Secondary | ICD-10-CM

## 2023-11-16 DIAGNOSIS — R7989 Other specified abnormal findings of blood chemistry: Secondary | ICD-10-CM

## 2023-11-16 LAB — LIPID PANEL

## 2023-11-16 MED ORDER — VALSARTAN-HYDROCHLOROTHIAZIDE 320-12.5 MG PO TABS: 1.0000 | ORAL_TABLET | Freq: Every day | ORAL | 2 refills | Status: DC

## 2023-11-16 MED ORDER — TIRZEPATIDE 10 MG/0.5ML ~~LOC~~ SOAJ: 10.0000 mg | SUBCUTANEOUS | 1 refills | Status: DC

## 2023-11-16 MED ORDER — EMPAGLIFLOZIN 10 MG PO TABS: 10.0000 mg | ORAL_TABLET | Freq: Every day | ORAL | 2 refills | Status: DC

## 2023-11-16 MED ORDER — TIRZEPATIDE 7.5 MG/0.5ML ~~LOC~~ SOAJ: 7.5000 mg | SUBCUTANEOUS | 0 refills | Status: DC

## 2023-11-16 MED ORDER — METFORMIN HCL 1000 MG PO TABS: 1000.0000 mg | ORAL_TABLET | Freq: Two times a day (BID) | ORAL | 2 refills | Status: DC

## 2023-11-16 MED ORDER — ICOSAPENT ETHYL 1 G PO CAPS: 2.0000 g | ORAL_CAPSULE | Freq: Two times a day (BID) | ORAL | 2 refills | Status: DC

## 2023-11-16 MED ORDER — CARVEDILOL 6.25 MG PO TABS: 6.2500 mg | ORAL_TABLET | Freq: Two times a day (BID) | ORAL | 2 refills | Status: DC

## 2023-11-16 MED ORDER — AMLODIPINE BESYLATE 10 MG PO TABS: 10.0000 mg | ORAL_TABLET | Freq: Every day | ORAL | 2 refills | Status: DC

## 2023-11-16 MED ORDER — TIRZEPATIDE 5 MG/0.5ML ~~LOC~~ SOAJ: 5.0000 mg | SUBCUTANEOUS | 0 refills | Status: DC

## 2023-11-16 NOTE — Progress Notes (Signed)
Subjective:  [name] is a 52 y.o. male who presents for Chief Complaint  Patient presents with   Diabetes    Diabetes, sugars been 180-200 or over. Been out of mounjaro over a month. No eye exam     Diabetes-he continues on metformin and Jardiance but recently ran out of [place] about 2 months ago.  He did not call for refill.  He is checking blood sugars.  Prior to running out of [facility] he was seeing 140 and less fasting blood sugars are doing great.  Lately his appetite is increased and he has been eating more calories and sugars has not looked as good lately.  Hypertension-compliant with amlodipine, carvedilol, valsartan HCT  Hyperlipidemia-compliant with Crestor and fish oil without complaint  He is due for his eye exam in January.  He ate a banana about 30 minutes ago but otherwise fasting.  No other aggravating or relieving factors.    No other c/o.  Past Medical History:  Diagnosis Date   Diabetes mellitus without complication (HCC)    High cholesterol    Hypertension    Current Outpatient Medications on File Prior to Visit  Medication Sig Dispense Refill   rosuvastatin (CRESTOR) 10 MG tablet TAKE 1 TABLET BY MOUTH EVERY DAY 30 tablet 1   No current facility-administered medications on file prior to visit.     The following portions of the patient's history were reviewed and updated as appropriate: allergies, current medications, past family history, past medical history, past social history, past surgical history and problem list.  ROS Otherwise as in subjective above    Objective: BP 130/80   Pulse 64   Wt 216 lb 12.8 oz (98.3 kg)   SpO2 98%   BMI 33.96 kg/m   BP Readings from Last 3 Encounters:  11/16/23 130/80  07/24/23 130/80  09/19/22 120/80   Wt Readings from Last 3 Encounters:  11/16/23 216 lb 12.8 oz (98.3 kg)  07/24/23 217 lb 9.6 oz (98.7 kg)  09/19/22 217 lb 12.8 oz (98.8 kg)    General appearance: alert, no distress,  well developed, well nourished Neck: supple, no lymphadenopathy, no thyromegaly, no masses Heart: RRR, normal S1, S2, no murmurs Lungs: CTA bilaterally, no wheezes, rhonchi, or rales Pulses: 2+ radial pulses, 2+ pedal pulses, normal cap refill Ext: no edema  Diabetic Foot Exam - Simple   Simple Foot Form Diabetic Foot exam was performed with the following findings: Yes 11/16/2023  9:42 AM  Visual Inspection See comments: Yes Sensation Testing See comments: Yes Pulse Check Posterior Tibialis and Dorsalis pulse intact bilaterally: Yes Comments Somewhat decreased sensation to monofilament of both feet, bilateral great toenails with some thickened toenails hypertrophic toenails, rest of toenails without obvious thickening or fungus       Assessment: Encounter Diagnoses  Name Primary?   Type 2 diabetes mellitus with diabetic neuropathy, unspecified whether long term insulin use (HCC) Yes   Screening for prostate cancer    Hyperlipidemia associated with type 2 diabetes mellitus (HCC)    Primary hypertension    Elevated LFTs      Plan: Diabetes Check your feet daily for sores and wounds and let someone know if you start to see any wounds or sores is not healing Consider going back to your foot doctor podiatrist for diabetic footcare and nail care Check your blood sugars fasting daily with goal of less than 130 fasting in the morning.  Call before running out of any medications Begin back  on Mounjaro starting with 5 mg for 1 month then go up to 7.5 mg the second month and then go up to the 10 mg Mounjaro for the third month Continue Jardiance 10 mg daily Continue metformin 1000 mg twice daily  High cholesterol and elevated triglycerides Continue rosuvastatin Crestor daily Continue fish oil Vasecepa 2 g twice daily  Hypertension Continue amlodipine 10 mg daily Continue carvedilol 6.25 mg twice daily Continue valsartan HCT 320/12.5 mg daily  We will call with lab  results  PSA prostate cancer screening lab today  See your eye doctor soon and make sure you have your yearly diabetic eye exam.  Make sure they send [place] a copy of this information    [name] was seen today for diabetes.  Diagnoses and all orders for this visit:  Type 2 diabetes mellitus with diabetic neuropathy, unspecified whether long term insulin use (HCC) -     Microalbumin/Creatinine Ratio, Urine -     Hemoglobin A1c -     CBC  Screening for prostate cancer -     PSA  Hyperlipidemia associated with type 2 diabetes mellitus (HCC) -     Lipid panel  Primary hypertension -     carvedilol (COREG) 6.25 MG tablet; Take 1 tablet (6.25 mg total) by mouth 2 (two) times daily.  Elevated LFTs -     CBC -     Hepatic function panel  Other orders -     tirzepatide (MOUNJARO) 7.5 MG/0.5ML Pen; Inject 7.5 mg into the skin once a week. -     tirzepatide (MOUNJARO) 10 MG/0.5ML Pen; Inject 10 mg into the skin once a week. -     tirzepatide [facility]) 5 MG/0.5ML Pen; Inject 5 mg into the skin once a week. -     amLODipine (NORVASC) 10 MG tablet; Take 1 tablet (10 mg total) by mouth daily. -     empagliflozin (JARDIANCE) 10 MG TABS tablet; Take 1 tablet (10 mg total) by mouth daily before breakfast. -     icosapent Ethyl (VASCEPA) 1 g capsule; Take 2 capsules (2 g total) by mouth 2 (two) times daily. -     metFORMIN (GLUCOPHAGE) 1000 MG tablet; Take 1 tablet (1,000 mg total) by mouth 2 (two) times daily with a meal. -     valsartan-hydrochlorothiazide (DIOVAN-HCT) 320-12.5 MG tablet; Take 1 tablet by mouth daily.    Follow up: pending labs

## 2023-11-17 NOTE — Progress Notes (Signed)
Hemoglobin A1c was 10.5%.  Not at goal.  Cholesterol could be a little bit better as well.  Still pending microalbumin kidney marker.  Prostate marker okay.     Get back on the [facility] as discussed.  Lets recheck in 2 months unless you are having trouble getting the [facility] in the meantime  Let me know before you run out of your next supply of Crestor so I can increase your dose to 20 mg daily

## 2023-11-18 LAB — LIPID PANEL
Cholesterol, Total: 128 mg/dL (ref 100–199)
HDL: 37 mg/dL — ABNORMAL LOW (ref >39)
LDL CALC COMMENT:: 3.5 ratio (ref 0.0–5.0)
LDL Chol Calc (NIH): 70 mg/dL (ref 0–99)
Triglycerides: 116 mg/dL (ref 0–149)
VLDL Cholesterol Cal: 21 mg/dL (ref 5–40)

## 2023-11-18 LAB — PSA: Prostate Specific Ag, Serum: 0.3 ng/mL (ref 0.0–4.0)

## 2023-11-18 LAB — CBC
Hematocrit: 43.7 % (ref 37.5–51.0)
Hemoglobin: 13.8 g/dL (ref 13.0–17.7)
MCH: 24.5 pg — ABNORMAL LOW (ref 26.6–33.0)
MCHC: 31.6 g/dL (ref 31.5–35.7)
MCV: 78 fL — ABNORMAL LOW (ref 79–97)
Platelets: 269 10*3/uL (ref 150–450)
RBC: 5.64 x10E6/uL (ref 4.14–5.80)
RDW: 15.1 % (ref 11.6–15.4)
WBC: 7.4 10*3/uL (ref 3.4–10.8)

## 2023-11-18 LAB — MICROALBUMIN / CREATININE URINE RATIO
Creatinine, Urine: 79.3 mg/dL
Microalb/Creat Ratio: 37 mg/g{creat} — ABNORMAL HIGH (ref 0–29)
Microalbumin, Urine: 29.6 ug/mL

## 2023-11-18 LAB — HEMOGLOBIN A1C
Est. average glucose Bld gHb Est-mCnc: 255 mg/dL
Hgb A1c MFr Bld: 10.5 % — ABNORMAL HIGH (ref 4.8–5.6)

## 2023-11-18 LAB — HEPATIC FUNCTION PANEL
ALT: 47 IU/L — ABNORMAL HIGH (ref 0–44)
AST: 38 IU/L (ref 0–40)
Albumin: 4.7 g/dL (ref 3.8–4.9)
Alkaline Phosphatase: 81 IU/L (ref 44–121)
Bilirubin Total: 0.3 mg/dL (ref 0.0–1.2)
Bilirubin, Direct: 0.14 mg/dL (ref 0.00–0.40)
Total Protein: 6.7 g/dL (ref 6.0–8.5)

## 2023-11-19 NOTE — Progress Notes (Signed)
Microalbumin kidney marker stable, continue current recommendations as per prior note

## 2024-01-12 ENCOUNTER — Other Ambulatory Visit: Payer: Self-pay | Admitting: Medical

## 2024-01-22 ENCOUNTER — Encounter: Payer: Self-pay | Admitting: Physician Assistant

## 2024-01-22 ENCOUNTER — Ambulatory Visit: Payer: 59 | Admitting: Physician Assistant

## 2024-01-22 VITALS — BP 122/84 | Temp 98.1°F | Ht 67.0 in | Wt 205.8 lb

## 2024-01-22 DIAGNOSIS — J302 Other seasonal allergic rhinitis: Secondary | ICD-10-CM

## 2024-01-22 MED ORDER — LEVOCETIRIZINE DIHYDROCHLORIDE 5 MG PO TABS: 5.0000 mg | ORAL_TABLET | Freq: Every evening | ORAL | 2 refills | Status: DC

## 2024-01-22 MED ORDER — FLUTICASONE PROPIONATE 50 MCG/ACT NA SUSP
2.0000 | Freq: Every day | NASAL | 6 refills | Status: DC
Start: 2024-01-22 — End: 2024-09-24

## 2024-01-22 NOTE — Progress Notes (Signed)
 Acute Office Visit  Subjective:     Patient ID: [name], male    DOB: 1971/08/09, 53 y.o.   MRN: [id number]   HPI Patient is in today for concerns of rhinorrhea and watery eyes. He states symptoms began yesterday and have worsened today. Associated symptoms include fatigue, scratchy throat, and cough. He reports reoccurring symptoms yearly around this time in the winter/spring. He denies fevers, shortness of breath, or chest pain. He has been taking benadryl as needed for symptoms.   Review of Systems  Constitutional:  Positive for malaise/fatigue. Negative for fever.  HENT:  Positive for sinus pain and sore throat. Negative for congestion, ear discharge and ear pain.   Eyes:  Positive for discharge.  Respiratory:  Positive for cough. Negative for wheezing.   Musculoskeletal:  Negative for myalgias.  Neurological:  Negative for headaches.        Objective:     BP 122/84   Temp 98.1 F (36.7 C) (Oral)   Ht 5\' 7"  (1.702 m)   Wt 205 lb 12.8 oz (93.4 kg)   BMI 32.23 kg/m   Physical Exam Vitals reviewed.  Constitutional:      General: He is not in acute distress.    Appearance: Normal appearance.  HENT:     Right Ear: Tympanic membrane normal.     Left Ear: Tympanic membrane normal.     Nose: Rhinorrhea present.     Mouth/Throat:     Mouth: Mucous membranes are moist.     Pharynx: Oropharynx is clear. No posterior oropharyngeal erythema.  Eyes:     Extraocular Movements: Extraocular movements intact.     Conjunctiva/sclera: Conjunctivae normal.  Cardiovascular:     Rate and Rhythm: Normal rate and regular rhythm.     Heart sounds: No murmur heard.    No friction rub. No gallop.  Pulmonary:     Effort: Pulmonary effort is normal.     Breath sounds: Normal breath sounds. No stridor. No wheezing, rhonchi or rales.  Musculoskeletal:        General: Normal range of motion.  Lymphadenopathy:     Cervical: No cervical adenopathy.  Skin:    General: Skin  is warm and dry.     Capillary Refill: Capillary refill takes less than 2 seconds.  Neurological:     General: No focal deficit present.     Mental Status: He is alert and oriented to person, place, and time.  Psychiatric:        Mood and Affect: Mood normal.        Behavior: Behavior normal.     No results found for any visits on 01/22/24.      Assessment & Plan:  Seasonal allergic rhinitis, unspecified trigger -     Levocetirizine Dihydrochloride; Take 1 tablet (5 mg total) by mouth every evening.  Dispense: 30 tablet; Refill: 2 -     Fluticasone Propionate; Place 2 sprays into both nostrils daily.  Dispense: 16 g; Refill: 6   Patient appears stable today. Benign exam. Likely related to allergic rhinitis. Supportive care reviewed with patient. Discussed with patient that there are no indications for antibiotics at this time.Tylenol  as needed. I advised Flonase for nasal congestion and ear pressure. Xyzal for allergy symptoms. Patient instructed to return to clinic if worsening shortness of breath, chest pain, hypoxia, or other concerns. Patient agreeable to plan.    Return if symptoms worsen or fail to improve.  Toni Amend Cara Aguino, PA-C

## 2024-02-19 ENCOUNTER — Ambulatory Visit: Payer: BC Managed Care – PPO | Admitting: Medical

## 2024-02-19 VITALS — BP 120/80 | HR 86 | Wt 206.6 lb

## 2024-02-19 DIAGNOSIS — L602 Onychogryphosis: Secondary | ICD-10-CM

## 2024-02-19 DIAGNOSIS — E1140 Type 2 diabetes mellitus with diabetic neuropathy, unspecified: Secondary | ICD-10-CM | POA: Diagnosis not present

## 2024-02-19 DIAGNOSIS — L509 Urticaria, unspecified: Secondary | ICD-10-CM

## 2024-02-19 DIAGNOSIS — E785 Hyperlipidemia, unspecified: Secondary | ICD-10-CM

## 2024-02-19 DIAGNOSIS — T7840XA Allergy, unspecified, initial encounter: Secondary | ICD-10-CM

## 2024-02-19 DIAGNOSIS — B351 Tinea unguium: Secondary | ICD-10-CM

## 2024-02-19 DIAGNOSIS — E1169 Type 2 diabetes mellitus with other specified complication: Secondary | ICD-10-CM

## 2024-02-19 DIAGNOSIS — I10 Essential (primary) hypertension: Secondary | ICD-10-CM | POA: Diagnosis not present

## 2024-02-19 LAB — HEMOGLOBIN A1C
Est. average glucose Bld gHb Est-mCnc: 154 mg/dL
Hgb A1c MFr Bld: 7 % — ABNORMAL HIGH (ref 4.8–5.6)

## 2024-02-19 MED ORDER — ROSUVASTATIN CALCIUM 10 MG PO TABS: 10.0000 mg | ORAL_TABLET | Freq: Every day | ORAL | 3 refills | Status: AC

## 2024-02-19 MED ORDER — TRIAMCINOLONE ACETONIDE 0.1 % EX CREA: 1.0000 | TOPICAL_CREAM | Freq: Two times a day (BID) | CUTANEOUS | 0 refills | Status: DC

## 2024-02-19 MED ORDER — HYDROXYZINE HCL 10 MG PO TABS: 10.0000 mg | ORAL_TABLET | Freq: Three times a day (TID) | ORAL | 0 refills | Status: AC | PRN

## 2024-02-19 MED ORDER — PREDNISONE 10 MG PO TABS: ORAL_TABLET | ORAL | 0 refills | Status: DC

## 2024-02-19 NOTE — Patient Instructions (Signed)
 Diabetes Check your feet daily for sores and wounds and let someone know if you start to see any wounds or sores is not healing Consider going back to your foot doctor podiatrist for diabetic foot care and nail care Check your blood sugars fasting daily with goal of less than 130 fasting in the morning.  Call before running out of any medications Continue Jardiance 10 mg daily Continue metformin 1000 mg twice daily STOP Mounjaro as this may be causing your allergic rash If lab not at goal, we may switch and add back Ozempic which you tolerated prior  Rash, allergic reaction STOP Mounjaro Begin short-term hydroxyzine 10 mg times a day for rash and itching over the next few days. While you are taking the hydroxyzine hold off on your daily Xyzal allergy pill Can use triamcinolone cream topically for the rash for the next several days If the rash happens to get way worse in the next 48 hours then begin the prednisone steroid Dosepak.  If the rash does not get worse and do not use the steroid pack as it can make your blood sugar go up If not much improved over the next 3 days, then let me know if  High cholesterol and elevated triglycerides Continue rosuvastatin Crestor 10mg  daily Continue fish oil Vascepa 2 g twice daily  Hypertension Continue amlodipine 10 mg daily Continue carvedilol 6.25 mg twice daily Continue valsartan HCT 320/12.5 mg daily  We will call with lab results   See your eye doctor soon and make sure you have your yearly diabetic eye exam.  Make sure they send [place] a copy of this information

## 2024-02-19 NOTE — Progress Notes (Signed)
 Subjective:  [name] is a 53 y.o. male who presents for Chief Complaint  Patient presents with   Medical Management of Chronic Issues    Med check- mounjaro causing rash all over body. Swelling of hands and arms. Wants to see about going back on ozempic. Had A1c done this morning in labs. No recent eye exam     Diabetes-been using metformin 100mg  BID, Jardiance 10mg  daily and mounjaro.  However, after using mounjaro, gets rash/hives the fires few days.  Thinks he is allergic too Mounjaro, but Ozempic did ok in past.   Last mounjaro dose yesterday.  Glucose readings lately 140-160s.    Hypertension-compliant with amlodipine, carvedilol, valsartan HCT  Hyperlipidemia-compliant with Crestor and fish oil without complaint  Regading rash, using cream OTC.  No other aggravating or relieving factors.    No other c/o.  Past Medical History:  Diagnosis Date   Diabetes mellitus without complication (HCC)    High cholesterol    Hypertension    Current Outpatient Medications on File Prior to Visit  Medication Sig Dispense Refill   amLODipine (NORVASC) 10 MG tablet Take 1 tablet (10 mg total) by mouth daily. 90 tablet 2   carvedilol (COREG) 6.25 MG tablet Take 1 tablet (6.25 mg total) by mouth 2 (two) times daily. 180 tablet 2   empagliflozin (JARDIANCE) 10 MG TABS tablet Take 1 tablet (10 mg total) by mouth daily before breakfast. 90 tablet 2   fluticasone (FLONASE) 50 MCG/ACT nasal spray Place 2 sprays into both nostrils daily. 16 g 6   icosapent Ethyl (VASCEPA) 1 g capsule Take 2 capsules (2 g total) by mouth 2 (two) times daily. 360 capsule 2   levocetirizine (XYZAL) 5 MG tablet Take 1 tablet (5 mg total) by mouth every evening. 30 tablet 2   metFORMIN (GLUCOPHAGE) 1000 MG tablet Take 1 tablet (1,000 mg total) by mouth 2 (two) times daily with a meal. 180 tablet 2   tirzepatide (MOUNJARO) 10 MG/0.5ML Pen Inject 10 mg into the skin once a week. 6 mL 1    valsartan-hydrochlorothiazide (DIOVAN-HCT) 320-12.5 MG tablet Take 1 tablet by mouth daily. 90 tablet 2   rosuvastatin (CRESTOR) 10 MG tablet TAKE 1 TABLET BY MOUTH EVERY DAY 30 tablet 2   No current facility-administered medications on file prior to visit.     The following portions of the patient's history were reviewed and updated as appropriate: allergies, current medications, past family history, past medical history, past social history, past surgical history and problem list.  ROS Otherwise as in subjective above    Objective: BP 120/80   Pulse 86   Wt 206 lb 9.6 oz (93.7 kg)   BMI 32.36 kg/m   BP Readings from Last 3 Encounters:  02/19/24 120/80  01/22/24 122/84  11/16/23 130/80   Wt Readings from Last 3 Encounters:  02/19/24 206 lb 9.6 oz (93.7 kg)  01/22/24 205 lb 12.8 oz (93.4 kg)  11/16/23 216 lb 12.8 oz (98.3 kg)    General appearance: alert, no distress, well developed, well nourished Neck: supple, no lymphadenopathy, no thyromegaly, no masses Heart: RRR, normal S1, S2, no murmurs Lungs: CTA bilaterally, no wheezes, rhonchi, or rales Pulses: 2+ radial pulses, 2+ pedal pulses, normal cap refill Ext: no edema  Diabetic Foot Exam - Simple   Simple Foot Form Diabetic Foot exam was performed with the following findings: Yes 02/19/2024  2:38 PM  Visual Inspection See comments: Yes Sensation Testing Intact to touch and  monofilament testing bilaterally: Yes Pulse Check Posterior Tibialis and Dorsalis pulse intact bilaterally: Yes Comments Thickened hypertrophic great toenails bilateral       Assessment: Encounter Diagnoses  Name Primary?   Type 2 diabetes mellitus with diabetic neuropathy, unspecified whether long term insulin use (HCC) Yes   Primary hypertension    Onychomycosis    Hypertrophic toenail    Hyperlipidemia associated with type 2 diabetes mellitus (HCC)    Allergic reaction, initial encounter    Urticaria       Plan: Diabetes Check your feet daily for sores and wounds and let someone know if you start to see any wounds or sores is not healing Consider going back to your foot doctor podiatrist for diabetic foot care and nail care Check your blood sugars fasting daily with goal of less than 130 fasting in the morning.  Call before running out of any medications Continue Jardiance 10 mg daily Continue metformin 1000 mg twice daily STOP Mounjaro as this may be causing your allergic rash If lab not at goal, we may switch and add back Ozempic which you tolerated prior  Rash, allergic reaction STOP Mounjaro Begin short-term hydroxyzine 10 mg times a day for rash and itching over the next few days. While you are taking the hydroxyzine hold off on your daily Xyzal allergy pill Can use triamcinolone cream topically for the rash for the next several days If the rash happens to get way worse in the next 48 hours then begin the prednisone steroid Dosepak.  If the rash does not get worse and do not use the steroid pack as it can make your blood sugar go up If not much improved over the next 3 days, then let me know if  High cholesterol and elevated triglycerides Continue rosuvastatin Crestor 10mg  daily Continue fish oil Vascepa 2 g twice daily  Hypertension Continue amlodipine 10 mg daily Continue carvedilol 6.25 mg twice daily Continue valsartan HCT 320/12.5 mg daily  We will call with lab results   See your eye doctor soon and make sure you have your yearly diabetic eye exam.  Make sure they send [place] a copy of this information    [name] was seen today for medical management of chronic issues.  Diagnoses and all orders for this visit:  Type 2 diabetes mellitus with diabetic neuropathy, unspecified whether long term insulin use (HCC) -     Hemoglobin A1c  Primary hypertension  Onychomycosis  Hypertrophic toenail  Hyperlipidemia associated with type 2 diabetes mellitus (HCC)  Allergic  reaction, initial encounter  Urticaria  Other orders -     hydrOXYzine (ATARAX) 10 MG tablet; Take 1 tablet (10 mg total) by mouth 3 (three) times daily as needed. -     triamcinolone cream (KENALOG) 0.1 %; Apply 1 Application topically 2 (two) times daily. -     predniSONE (DELTASONE) 10 MG tablet; 6 tablets all together day 1, 5 tablets day 2, 4 tablets day 3, 3 tablets day 4, 2 tablets day 5, 1 tablet day 6.    Follow up: pending labs

## 2024-02-20 ENCOUNTER — Other Ambulatory Visit (HOSPITAL_COMMUNITY): Payer: Self-pay

## 2024-02-20 ENCOUNTER — Telehealth: Payer: Self-pay

## 2024-02-20 ENCOUNTER — Other Ambulatory Visit: Payer: Self-pay | Admitting: Medical

## 2024-02-20 MED ORDER — OZEMPIC (1 MG/DOSE) 2 MG/1.5ML ~~LOC~~ SOPN: 1.0000 mg | PEN_INJECTOR | SUBCUTANEOUS | 2 refills | Status: DC

## 2024-02-20 NOTE — Telephone Encounter (Signed)
 Pharmacy Patient Advocate Encounter   Received notification from CoverMyMeds that prior authorization for Ozempic (1 MG/DOSE) 4MG /3ML pen-injectors is required/requested.   Insurance verification completed.   The patient is insured through [facility] .   Per test claim: PA required; PA submitted to above mentioned insurance via CoverMyMeds Key/confirmation #/EOC (Key: [id number])   Status is pending

## 2024-02-20 NOTE — Progress Notes (Signed)
 Your hemoglobin A1c looks much better at 7.0.  I sent Ozempic that you could restart in a week assuming the rash resolves.  Let me know if there is any issues getting Ozempic at the pharmacy  Continue other medicines as usual  Follow-up in 4 months

## 2024-02-21 ENCOUNTER — Other Ambulatory Visit (HOSPITAL_COMMUNITY): Payer: Self-pay

## 2024-02-21 NOTE — Telephone Encounter (Signed)
 Pharmacy Patient Advocate Encounter  Received notification from [facility] that Prior Authorization for Ozempic (1 MG/DOSE) 4MG /3ML pen-injectors  has been APPROVED from 3.26.25 to 3.26.28. Ran test claim, Copay is $RTS LAST FILLED 3.26.25. This test claim was processed through [facility]- copay amounts may vary at other pharmacies due to pharmacy/plan contracts, or as the patient moves through the different stages of their insurance plan.

## 2024-06-18 ENCOUNTER — Other Ambulatory Visit: Payer: Self-pay | Admitting: Medical

## 2024-06-24 ENCOUNTER — Encounter: Payer: Self-pay | Admitting: Medical

## 2024-06-24 ENCOUNTER — Ambulatory Visit: Admitting: Medical

## 2024-06-24 ENCOUNTER — Other Ambulatory Visit (HOSPITAL_COMMUNITY): Payer: Self-pay

## 2024-06-24 ENCOUNTER — Telehealth: Payer: Self-pay | Admitting: Pharmacy Technician

## 2024-06-24 VITALS — BP 134/80 | HR 64 | Ht 66.0 in | Wt 213.2 lb

## 2024-06-24 DIAGNOSIS — E785 Hyperlipidemia, unspecified: Secondary | ICD-10-CM | POA: Diagnosis not present

## 2024-06-24 DIAGNOSIS — I10 Essential (primary) hypertension: Secondary | ICD-10-CM | POA: Diagnosis not present

## 2024-06-24 DIAGNOSIS — E1140 Type 2 diabetes mellitus with diabetic neuropathy, unspecified: Secondary | ICD-10-CM | POA: Diagnosis not present

## 2024-06-24 DIAGNOSIS — E1169 Type 2 diabetes mellitus with other specified complication: Secondary | ICD-10-CM

## 2024-06-24 LAB — POCT GLYCOSYLATED HEMOGLOBIN (HGB A1C): Hemoglobin A1C: 9.8 % — AB (ref 4.0–5.6)

## 2024-06-24 MED ORDER — ICOSAPENT ETHYL 1 G PO CAPS
2.0000 g | ORAL_CAPSULE | Freq: Two times a day (BID) | ORAL | 2 refills | Status: AC
Start: 2024-06-24 — End: ?

## 2024-06-24 MED ORDER — AMLODIPINE BESYLATE 10 MG PO TABS: 10.0000 mg | ORAL_TABLET | Freq: Every day | ORAL | 2 refills | Status: AC

## 2024-06-24 MED ORDER — VALSARTAN-HYDROCHLOROTHIAZIDE 320-12.5 MG PO TABS: 1.0000 | ORAL_TABLET | Freq: Every day | ORAL | 2 refills | Status: AC

## 2024-06-24 MED ORDER — SYNJARDY XR 5-1000 MG PO TB24: 1.0000 | ORAL_TABLET | Freq: Two times a day (BID) | ORAL | 1 refills | Status: DC

## 2024-06-24 MED ORDER — CARVEDILOL 6.25 MG PO TABS: 6.2500 mg | ORAL_TABLET | Freq: Two times a day (BID) | ORAL | 2 refills | Status: AC

## 2024-06-24 MED ORDER — RYBELSUS 3 MG PO TABS: 3.0000 mg | ORAL_TABLET | Freq: Every day | ORAL | 0 refills | Status: DC

## 2024-06-24 NOTE — Progress Notes (Signed)
 Subjective:  [name] is a 53 y.o. male who presents for Chief Complaint  Patient presents with   Diabetes    Fasting follow up, no new concerns.      Here for med check.  He is taking metformin  1000 mg twice daily, Jardiance  10 mg daily, and was on Ozempic .  However he wasn't tolerating ozempic , made him feel bad.  Stopped it a month ago.    Checks glucose - it was looking better on ozempic  but then he stopped ozempic .  Felt malaise, tired, weak on this medication.    Hypertension-compliant with carvedilol  twice daily and amlodipine  daily and valsartan  HCT daily  Hyperlipidemia-compliant with Crestor  and vascepa  fish oil  Blood sugars have been running higher since she had to quit Ozempic .  Otherwise feeling really good   No other aggravating or relieving factors.    No other c/o.  Past Medical History:  Diagnosis Date   Diabetes mellitus without complication (HCC)    High cholesterol    Hypertension    Current Outpatient Medications on File Prior to Visit  Medication Sig Dispense Refill   empagliflozin  (JARDIANCE ) 10 MG TABS tablet Take 1 tablet (10 mg total) by mouth daily before breakfast. 90 tablet 2   hydrOXYzine  (ATARAX ) 10 MG tablet Take 1 tablet (10 mg total) by mouth 3 (three) times daily as needed. 30 tablet 0   levocetirizine (XYZAL ) 5 MG tablet Take 1 tablet (5 mg total) by mouth every evening. 30 tablet 2   metFORMIN  (GLUCOPHAGE ) 1000 MG tablet Take 1 tablet (1,000 mg total) by mouth 2 (two) times daily with a meal. 180 tablet 2   rosuvastatin  (CRESTOR ) 10 MG tablet Take 1 tablet (10 mg total) by mouth daily. 90 tablet 3   fluticasone  (FLONASE ) 50 MCG/ACT nasal spray Place 2 sprays into both nostrils daily. (Patient not taking: Reported on 06/24/2024) 16 g 6   triamcinolone  cream (KENALOG ) 0.1 % APPLY TO AFFECTED AREA TWICE A DAY (Patient not taking: Reported on 06/24/2024) 30 g 0   No current facility-administered medications on file prior to visit.     The following portions of the patient's history were reviewed and updated as appropriate: allergies, current medications, past family history, past medical history, past social history, past surgical history and problem list.  ROS Otherwise as in subjective above    Objective: BP 134/80   Pulse 64   Ht 5' 6 (1.676 m)   Wt 213 lb 3.2 oz (96.7 kg)   SpO2 96%   BMI 34.41 kg/m   Wt Readings from Last 3 Encounters:  06/24/24 213 lb 3.2 oz (96.7 kg)  02/19/24 206 lb 9.6 oz (93.7 kg)  01/22/24 205 lb 12.8 oz (93.4 kg)   BP Readings from Last 3 Encounters:  06/24/24 134/80  02/19/24 120/80  01/22/24 122/84    General appearance: alert, no distress, well developed, well nourished Neck: supple, no lymphadenopathy, no thyromegaly, no masses Heart: RRR, normal S1, S2, no murmurs Lungs: CTA bilaterally, no wheezes, rhonchi, or rales Pulses: 2+ radial pulses, 2+ pedal pulses, normal cap refill Ext: no edema  Diabetic Foot Exam - Simple   Simple Foot Form Diabetic Foot exam was performed with the following findings: Yes 06/24/2024 10:37 AM  Visual Inspection See comments: Yes Sensation Testing Intact to touch and monofilament testing bilaterally: Yes Pulse Check Posterior Tibialis and Dorsalis pulse intact bilaterally: Yes Comments Right great toenail with mild thickened nails.      Assessment: Encounter  Diagnoses  Name Primary?   Type 2 diabetes mellitus with diabetic neuropathy, unspecified whether long term insulin use (HCC) Yes   Hyperlipidemia associated with type 2 diabetes mellitus (HCC)    Primary hypertension      Plan:  Diabetes Hgba1c 9.8% today Check your blood sugars most mornings.  Goal is between 80 and 130 fasting. Continue Jardiance  10 mg daily Continue metformin  1000 mg twice daily You did not tolerate Ozempic  or Mounjaro  due to rash Lets try Rybelsus  oral tablet daily.   Do 1 month at 3mg , then second month at 7mg , then go up to 14mg  after  that However, if any rash or problems with Rybelsus , let me know right away I will also send prescription for Jardiance /Metformin  combo called Synjardy .  If insurance will cover this, it will reduce your daily pill count   Hypertension high blood pressure Continue amlodipine  10 mg daily Continue carvedilol  6.25 mg twice daily Continue valsartan  HCT 320/12.5 mg daily   High cholesterol Continue rosuvastatin  Crestor  10 mg daily Continue Vascepa  fish oil 2 capsules twice daily    Farzad was seen today for diabetes.  Diagnoses and all orders for this visit:  Type 2 diabetes mellitus with diabetic neuropathy, unspecified whether long term insulin use (HCC) -     HgB A1c  Hyperlipidemia associated with type 2 diabetes mellitus (HCC)  Primary hypertension -     carvedilol  (COREG ) 6.25 MG tablet; Take 1 tablet (6.25 mg total) by mouth 2 (two) times daily.  Other orders -     Empagliflozin -metFORMIN  HCl ER (SYNJARDY  XR) 03-999 MG TB24; Take 1 tablet by mouth in the morning and at bedtime. -     amLODipine  (NORVASC ) 10 MG tablet; Take 1 tablet (10 mg total) by mouth daily. -     icosapent  Ethyl (VASCEPA ) 1 g capsule; Take 2 capsules (2 g total) by mouth 2 (two) times daily. -     valsartan -hydrochlorothiazide  (DIOVAN -HCT) 320-12.5 MG tablet; Take 1 tablet by mouth daily. -     Semaglutide  (RYBELSUS ) 3 MG TABS; Take 1 tablet (3 mg total) by mouth daily.    Follow up: pending labs

## 2024-06-24 NOTE — Telephone Encounter (Signed)
 Pharmacy Patient Advocate Encounter   Received notification from Onbase that prior authorization for Rybelsus  3MG  tablets is required/requested.   Insurance verification completed.   The patient is insured through [facility] .   Per test claim: PA required; PA started via CoverMyMeds. KEY [id number] . Waiting for clinical questions to populate.

## 2024-06-24 NOTE — Patient Instructions (Signed)
 Diabetes Hgba1c 9.8% today Check your blood sugars most mornings.  Goal is between 80 and 130 fasting. Continue Jardiance  10 mg daily Continue metformin  1000 mg twice daily You did not tolerate Ozempic  or Mounjaro  due to rash Lets try Rybelsus  oral tablet daily.   Do 1 month at 3mg , then second month at 7mg , then go up to 14mg  after that However, if any rash or problems with Rybelsus , let me know right away I will also send prescription for Jardiance /Metformin  combo called Synjardy .  If insurance will cover this, it will reduce your daily pill count   Hypertension high blood pressure Continue amlodipine  10 mg daily Continue carvedilol  6.25 mg twice daily Continue valsartan  HCT 320/12.5 mg daily   High cholesterol Continue rosuvastatin  Crestor  10 mg daily Continue Vascepa  fish oil 2 capsules twice daily

## 2024-06-25 ENCOUNTER — Ambulatory Visit: Payer: Self-pay | Admitting: Medical

## 2024-06-25 ENCOUNTER — Other Ambulatory Visit (HOSPITAL_COMMUNITY): Payer: Self-pay

## 2024-06-25 NOTE — Progress Notes (Signed)
Abstract eye exam

## 2024-06-25 NOTE — Telephone Encounter (Signed)
 Pharmacy Patient Advocate Encounter  Received notification from [facility] that Prior Authorization for Rybelsus  3MG  tablets has been APPROVED from 06/25/24 to 06/26/27. Ran test claim, Copay is $14.99. This test claim was processed through [facility]- copay amounts may vary at other pharmacies due to pharmacy/plan contracts, or as the patient moves through the different stages of their insurance plan.   PA #/Case ID/Reference #: [id number]

## 2024-08-15 ENCOUNTER — Telehealth: Payer: Self-pay

## 2024-08-15 ENCOUNTER — Other Ambulatory Visit: Payer: Self-pay | Admitting: Medical

## 2024-08-15 ENCOUNTER — Other Ambulatory Visit (HOSPITAL_COMMUNITY): Payer: Self-pay

## 2024-08-15 NOTE — Telephone Encounter (Signed)
 Pharmacy Patient Advocate Encounter   Received notification from CoverMyMeds that prior authorization for Vascepa  1GM capsules  is required/requested.   Insurance verification completed.   The patient is insured through [facility] .   Per test claim: PA required; PA submitted to above mentioned insurance via Latent Key/confirmation #/EOC [id number] Status is pending

## 2024-08-16 ENCOUNTER — Other Ambulatory Visit (HOSPITAL_COMMUNITY): Payer: Self-pay

## 2024-08-18 ENCOUNTER — Other Ambulatory Visit (HOSPITAL_COMMUNITY): Payer: Self-pay

## 2024-08-18 NOTE — Telephone Encounter (Signed)
 harmacy Patient Advocate Encounter  Received notification from [facility] that Prior Authorization for Vascepa  1G has been APPROVED from 9.19.25 to 9.18.28. Ran test claim, Copay is $RTS, RX LAST FILLED ON 9.11. This test claim was processed through [facility]- copay amounts may vary at other pharmacies due to pharmacy/plan contracts, or as the patient moves through the different stages of their insurance plan.

## 2024-09-24 ENCOUNTER — Ambulatory Visit: Admitting: Medical

## 2024-09-24 VITALS — BP 122/80 | HR 58 | Wt 217.6 lb

## 2024-09-24 DIAGNOSIS — E1169 Type 2 diabetes mellitus with other specified complication: Secondary | ICD-10-CM | POA: Diagnosis not present

## 2024-09-24 DIAGNOSIS — E1140 Type 2 diabetes mellitus with diabetic neuropathy, unspecified: Secondary | ICD-10-CM | POA: Diagnosis not present

## 2024-09-24 DIAGNOSIS — Z23 Encounter for immunization: Secondary | ICD-10-CM

## 2024-09-24 DIAGNOSIS — E785 Hyperlipidemia, unspecified: Secondary | ICD-10-CM

## 2024-09-24 DIAGNOSIS — I10 Essential (primary) hypertension: Secondary | ICD-10-CM | POA: Diagnosis not present

## 2024-09-24 DIAGNOSIS — M722 Plantar fascial fibromatosis: Secondary | ICD-10-CM

## 2024-09-24 LAB — POCT GLYCOSYLATED HEMOGLOBIN (HGB A1C): Hemoglobin A1C: 10 % — AB (ref 4.0–5.6)

## 2024-09-24 MED ORDER — BD PEN NEEDLE MICRO ULTRAFINE 32G X 6 MM MISC: 1.0000 | Freq: Every day | 2 refills | Status: AC

## 2024-09-24 MED ORDER — TOUJEO SOLOSTAR 300 UNIT/ML ~~LOC~~ SOPN: 10.0000 [IU] | PEN_INJECTOR | Freq: Every day | SUBCUTANEOUS | 2 refills | Status: DC

## 2024-09-24 MED ORDER — SYNJARDY 12.5-1000 MG PO TABS: 1.0000 | ORAL_TABLET | Freq: Two times a day (BID) | ORAL | 1 refills | Status: DC

## 2024-09-24 NOTE — Patient Instructions (Signed)
 Recommendations: Stop plain metformin  Begin Toujeo long-acting insulin 10 units daily Begin Synjardy  samples 25/1000 mg once daily for 3 weeks After you finish the samples, change to the regular release Synjardy  12.03/999 mg twice daily Be really careful with diet Exercise 4 to 5 days/week Continue the rest your medicines as usual   Recommendations:  Plantar fascitis Every morning try doing a tennis ball massage with feet on the floor and towel stretch behind the ball of the foot to stretch the plantar fascia Order plantar fascitis night time 90 degree splints online, through [employer] for example.   They are typically about $25 each Avoid going barefoot since your feet flatten out without good arch support I recommend you use arch support shoe INSERTS.   This will help support the plantar fascia and arches You can use over the counter pain reliever for worse pain    Plantar Fasciitis Plantar fasciitis is a painful foot condition that affects the heel. It occurs when the band of tissue that connects the toes to the heel bone (plantar fascia) becomes irritated. This can happen after exercising too much or doing other repetitive activities (overuse injury). The pain from plantar fasciitis can range from mild irritation to severe pain that makes it difficult for you to walk or move. The pain is usually worse in the morning or after you have been sitting or lying down for a while. What are the causes? This condition may be caused by: Standing for long periods of time. Wearing shoes that do not fit. Doing high-impact activities, including running, aerobics, and ballet. Being overweight. Having an abnormal way of walking (gait). Having tight calf muscles. Having high arches in your feet. Starting a new athletic activity.  What are the signs or symptoms? The main symptom of this condition is heel pain. Other symptoms include: Pain that gets worse after activity or exercise. Pain that is  worse in the morning or after resting. Pain that goes away after you walk for a few minutes.  How is this diagnosed? This condition may be diagnosed based on your signs and symptoms. Your health care provider will also do a physical exam to check for: A tender area on the bottom of your foot. A high arch in your foot. Pain when you move your foot. Difficulty moving your foot.  You may also need to have imaging studies to confirm the diagnosis. These can include: X-rays. Ultrasound. MRI.  How is this treated? Treatment for plantar fasciitis depends on the severity of the condition. Your treatment may include: Rest, ice, and over-the-counter pain medicines to manage your pain. Exercises to stretch your calves and your plantar fascia. A splint that holds your foot in a stretched, upward position while you sleep (night splint). Physical therapy to relieve symptoms and prevent problems in the future. Cortisone injections to relieve severe pain. Extracorporeal shock wave therapy (ESWT) to stimulate damaged plantar fascia with electrical impulses. It is often used as a last resort before surgery. Surgery, if other treatments have not worked after 12 months.  Follow these instructions at home: Take medicines only as directed by your health care provider. Avoid activities that cause pain. Roll the bottom of your foot over a bag of ice or a bottle of cold water. Do this for 20 minutes, 3-4 times a day. Perform simple stretches as directed by your health care provider. Try wearing athletic shoes with air-sole or gel-sole cushions or soft shoe inserts. Wear a night splint while sleeping, if directed  by your health care provider. Keep all follow-up appointments with your health care provider. How is this prevented? Do not perform exercises or activities that cause heel pain. Consider finding low-impact activities if you continue to have problems. Lose weight if you need to. The best way to  prevent plantar fasciitis is to avoid the activities that aggravate your plantar fascia. Contact a health care provider if: Your symptoms do not go away after treatment with home care measures. Your pain gets worse. Your pain affects your ability to move or do your daily activities. This information is not intended to replace advice given to you by your health care provider. Make sure you discuss any questions you have with your health care provider. Document Released: 08/08/2001 Document Revised: 04/17/2016 Document Reviewed: 09/23/2014 Elsevier Interactive Patient Education  [employer].

## 2024-09-24 NOTE — Progress Notes (Signed)
 Subjecitve: Chief Complaint  Patient presents with   Follow-up    3 month follow-up. Only taking metformin  for diabetes but ran out 3 days ago.     [name] is a 53 year old male with diabetes who presents for a medication check.  He has been experiencing challenges in managing his diabetes. Previously, he was on metformin , which he took twice daily until it ran out three days ago. He recalls being prescribed Rybelsus  and Jardiance  but did not continue these medications due to refill issues. His blood sugar levels have been consistently high, ranging from 200 to 380 mg/dL, and his last J8r was elevated at around 10%.  He experiences pain in his left foot, specifically in the heel and the bottom of the foot, which occurs when he gets up at night and when walking. No injury to the foot. The pain is exacerbated by wearing work boots or flip flops, and he notes that his feet tend to flatten out, which may contribute to the discomfort.  His current medications include amlodipine  10 mg daily, carvedilol  6.25 mg twice daily, valsartan  HCT 320/12.5 mg daily for blood pressure, and Crestor  10 mg daily for cholesterol. He also takes Vascepa  twice daily.  No other aggravating or relieving factors. No other complaint.  Past Medical History:  Diagnosis Date   Diabetes mellitus without complication (HCC)    High cholesterol    Hypertension    Current Outpatient Medications on File Prior to Visit  Medication Sig Dispense Refill   amLODipine  (NORVASC ) 10 MG tablet Take 1 tablet (10 mg total) by mouth daily. 90 tablet 2   carvedilol  (COREG ) 6.25 MG tablet Take 1 tablet (6.25 mg total) by mouth 2 (two) times daily. 180 tablet 2   icosapent  Ethyl (VASCEPA ) 1 g capsule Take 2 capsules (2 g total) by mouth 2 (two) times daily. 360 capsule 2   levocetirizine (XYZAL ) 5 MG tablet Take 1 tablet (5 mg total) by mouth every evening. 30 tablet 2   rosuvastatin  (CRESTOR ) 10 MG tablet Take 1 tablet (10  mg total) by mouth daily. 90 tablet 3   valsartan -hydrochlorothiazide  (DIOVAN -HCT) 320-12.5 MG tablet Take 1 tablet by mouth daily. 90 tablet 2   hydrOXYzine  (ATARAX ) 10 MG tablet Take 1 tablet (10 mg total) by mouth 3 (three) times daily as needed. 30 tablet 0   triamcinolone  cream (KENALOG ) 0.1 % APPLY TO AFFECTED AREA TWICE A DAY (Patient not taking: Reported on 06/24/2024) 30 g 0   No current facility-administered medications on file prior to visit.    ROS as in subjective  Objective: BP 122/80   Pulse (!) 58   Wt 217 lb 9.6 oz (98.7 kg)   SpO2 98%   BMI 35.12 kg/m   Gen: wd, wn, nad Heart regular rate rhythm, normal S1-S2, no murmurs Lungs clear Pulses within normal limits upper and lower extremities  Diabetic Foot Exam - Simple   Simple Foot Form Diabetic Foot exam was performed with the following findings: Yes 09/24/2024 12:51 PM  Visual Inspection Sensation Testing Pulse Check Comments Somewhat hypertrophic great toenails bilaterally Sensation normal, pulses normal Tender right heel and ball of foot, normal range of motion of ankle and toes, otherwise tender, relatively flat arches bilaterally      Assessment: Encounter Diagnoses  Name Primary?   Type 2 diabetes mellitus with diabetic neuropathy, unspecified whether long term insulin use (HCC) Yes   COVID-19 vaccine administered    Needs flu shot  Primary hypertension    Hyperlipidemia associated with type 2 diabetes mellitus (HCC)    Plantar fasciitis     Plan: Diabetes:  Recommendations: Stop plain metformin  Begin Toujeo long-acting insulin 10 units daily Begin Synjardy  samples 25/1000 mg once daily for 3 weeks After you finish the samples, change to the regular release Synjardy  12.03/999 mg twice daily Be really careful with diet Exercise 4 to 5 days/week Continue the rest your medicines as usual Of note prior to his last visit he was on Jardiance  and metformin  but we changed to Synjardy  last  visit but something listed went wrong at the pharmacy insurance did not cover medicine but he never called back.  Thus he has only been taking metformin .  We also prescribed Rybelsus  last visit.  Not sure he ever started taking this  Hypertension - Continue carvedilol  6.25 mg twice daily - Continue amlodipine  10 mg daily - Continue valsartan  HCT 320/12.5 mg daily  Hyperlipidemia -Continue Vascepa  2 g twice daily - Continue rosuvastatin  20 mg daily  Counseled on the influenza virus vaccine.   Influenza vaccine given after consent obtained.  Counseled on the covid virus vaccine.   Covid vaccine given after consent obtained.   Recommendations:  Plantar fascitis Every morning try doing a tennis ball massage with feet on the floor and towel stretch behind the ball of the foot to stretch the plantar fascia Order plantar fascitis night time 90 degree splints online, through [employer] for example.   They are typically about $25 each Avoid going barefoot since your feet flatten out without good arch support I recommend you use arch support shoe INSERTS.   This will help support the plantar fascia and arches You can use over the counter pain reliever for worse pain    Plantar Fasciitis Plantar fasciitis is a painful foot condition that affects the heel. It occurs when the band of tissue that connects the toes to the heel bone (plantar fascia) becomes irritated. This can happen after exercising too much or doing other repetitive activities (overuse injury). The pain from plantar fasciitis can range from mild irritation to severe pain that makes it difficult for you to walk or move. The pain is usually worse in the morning or after you have been sitting or lying down for a while. What are the causes? This condition may be caused by: Standing for long periods of time. Wearing shoes that do not fit. Doing high-impact activities, including running, aerobics, and ballet. Being overweight. Having an  abnormal way of walking (gait). Having tight calf muscles. Having high arches in your feet. Starting a new athletic activity.  What are the signs or symptoms? The main symptom of this condition is heel pain. Other symptoms include: Pain that gets worse after activity or exercise. Pain that is worse in the morning or after resting. Pain that goes away after you walk for a few minutes.  How is this diagnosed? This condition may be diagnosed based on your signs and symptoms. Your health care provider will also do a physical exam to check for: A tender area on the bottom of your foot. A high arch in your foot. Pain when you move your foot. Difficulty moving your foot.  You may also need to have imaging studies to confirm the diagnosis. These can include: X-rays. Ultrasound. MRI.  How is this treated? Treatment for plantar fasciitis depends on the severity of the condition. Your treatment may include: Rest, ice, and over-the-counter pain medicines to manage your pain.  Exercises to stretch your calves and your plantar fascia. A splint that holds your foot in a stretched, upward position while you sleep (night splint). Physical therapy to relieve symptoms and prevent problems in the future. Cortisone injections to relieve severe pain. Extracorporeal shock wave therapy (ESWT) to stimulate damaged plantar fascia with electrical impulses. It is often used as a last resort before surgery. Surgery, if other treatments have not worked after 12 months.  Follow these instructions at home: Take medicines only as directed by your health care provider. Avoid activities that cause pain. Roll the bottom of your foot over a bag of ice or a bottle of cold water. Do this for 20 minutes, 3-4 times a day. Perform simple stretches as directed by your health care provider. Try wearing athletic shoes with air-sole or gel-sole cushions or soft shoe inserts. Wear a night splint while sleeping, if directed  by your health care provider. Keep all follow-up appointments with your health care provider. How is this prevented? Do not perform exercises or activities that cause heel pain. Consider finding low-impact activities if you continue to have problems. Lose weight if you need to. The best way to prevent plantar fasciitis is to avoid the activities that aggravate your plantar fascia. Contact a health care provider if: Your symptoms do not go away after treatment with home care measures. Your pain gets worse. Your pain affects your ability to move or do your daily activities. This information is not intended to replace advice given to you by your health care provider. Make sure you discuss any questions you have with your health care provider. Document Released: 08/08/2001 Document Revised: 04/17/2016 Document Reviewed: 09/23/2014 Elsevier Interactive Patient Education  2018 [employer].     F/u pending labs

## 2024-09-25 ENCOUNTER — Ambulatory Visit: Payer: Self-pay | Admitting: Medical

## 2024-09-25 LAB — COMPREHENSIVE METABOLIC PANEL WITH GFR
ALT: 25 IU/L (ref 0–44)
AST: 21 IU/L (ref 0–40)
Albumin: 4.7 g/dL (ref 3.8–4.9)
Alkaline Phosphatase: 78 IU/L (ref 47–123)
BUN/Creatinine Ratio: 16 (ref 9–20)
BUN: 13 mg/dL (ref 6–24)
Bilirubin Total: 0.4 mg/dL (ref 0.0–1.2)
CO2: 26 mmol/L (ref 20–29)
Calcium: 10 mg/dL (ref 8.7–10.2)
Chloride: 96 mmol/L (ref 96–106)
Creatinine, Ser: 0.81 mg/dL (ref 0.76–1.27)
Globulin, Total: 2.5 g/dL (ref 1.5–4.5)
Glucose: 276 mg/dL — ABNORMAL HIGH (ref 70–99)
Potassium: 3.5 mmol/L (ref 3.5–5.2)
Sodium: 137 mmol/L (ref 134–144)
Total Protein: 7.2 g/dL (ref 6.0–8.5)
eGFR: 105 mL/min/1.73 (ref >59)

## 2024-09-25 LAB — LIPID PANEL
Chol/HDL Ratio: 3.4 ratio (ref 0.0–5.0)
Cholesterol, Total: 131 mg/dL (ref 100–199)
HDL: 38 mg/dL — ABNORMAL LOW (ref >39)
LDL Chol Calc (NIH): 73 mg/dL (ref 0–99)
Triglycerides: 106 mg/dL (ref 0–149)
VLDL Cholesterol Cal: 20 mg/dL (ref 5–40)

## 2024-09-25 NOTE — Progress Notes (Signed)
 Liver, kidney, electrolytes normal.  Cholesterol stable.  As we discussed yesterday: Stop plain metformin  Begin Toujeo long-acting insulin 10 units daily Begin Synjardy  samples 25/1000 mg once daily for 3 weeks After you finish the samples, change to the regular release Synjardy  12.03/999 mg twice daily Be really careful with diet Exercise 4 to 5 days/week Continue the rest of your medicines as usual  Recheck in 4-6 wk

## 2024-10-13 ENCOUNTER — Other Ambulatory Visit: Payer: Self-pay | Admitting: Medical

## 2024-10-13 NOTE — Telephone Encounter (Unsigned)
 Copied from CRM #[id number]. Topic: Clinical - Medication Refill >> Oct 13, 2024  1:20 PM Yolanda T wrote: Medication: Empagliflozin -metFORMIN  HCl (SYNJARDY ) 12.03-999 MG TABS  Has the patient contacted their pharmacy? Yes Contact provider  This is the patient's preferred pharmacy:  [id number] [place] [place] - [address] [place] [address] Phone: [phone] Fax: [phone]  Is this the correct pharmacy for this prescription? Yes  Has the prescription been filled recently? Yes  Is the patient out of the medication? Yes  Has the patient been seen for an appointment in the last year OR does the patient have an upcoming appointment? Yes  Can we respond through MyChart? No  Agent: Please be advised that Rx refills may take up to 3 business days. We ask that you follow-up with your pharmacy.

## 2024-10-14 MED ORDER — SYNJARDY 12.5-1000 MG PO TABS: 1.0000 | ORAL_TABLET | Freq: Two times a day (BID) | ORAL | 1 refills | Status: AC

## 2024-12-25 ENCOUNTER — Ambulatory Visit: Admitting: Medical

## 2024-12-25 VITALS — BP 130/82 | HR 58 | Wt 211.6 lb

## 2024-12-25 DIAGNOSIS — J301 Allergic rhinitis due to pollen: Secondary | ICD-10-CM

## 2024-12-25 DIAGNOSIS — Z23 Encounter for immunization: Secondary | ICD-10-CM | POA: Diagnosis not present

## 2024-12-25 DIAGNOSIS — E1169 Type 2 diabetes mellitus with other specified complication: Secondary | ICD-10-CM | POA: Diagnosis not present

## 2024-12-25 DIAGNOSIS — E1140 Type 2 diabetes mellitus with diabetic neuropathy, unspecified: Secondary | ICD-10-CM

## 2024-12-25 DIAGNOSIS — E785 Hyperlipidemia, unspecified: Secondary | ICD-10-CM

## 2024-12-25 DIAGNOSIS — I10 Essential (primary) hypertension: Secondary | ICD-10-CM

## 2024-12-25 DIAGNOSIS — Z125 Encounter for screening for malignant neoplasm of prostate: Secondary | ICD-10-CM | POA: Diagnosis not present

## 2024-12-25 DIAGNOSIS — R0989 Other specified symptoms and signs involving the circulatory and respiratory systems: Secondary | ICD-10-CM | POA: Diagnosis not present

## 2024-12-25 DIAGNOSIS — J302 Other seasonal allergic rhinitis: Secondary | ICD-10-CM

## 2024-12-25 DIAGNOSIS — R300 Dysuria: Secondary | ICD-10-CM | POA: Diagnosis not present

## 2024-12-25 LAB — POCT URINALYSIS DIP (PROADVANTAGE DEVICE)
Bilirubin, UA: NEGATIVE
Blood, UA: NEGATIVE
Glucose, UA: >=1000 mg/dL — AB
Ketones, POC UA: NEGATIVE mg/dL
Leukocytes, UA: NEGATIVE
Nitrite, UA: NEGATIVE
Protein Ur, POC: NEGATIVE mg/dL
Specific Gravity, Urine: 1.005
Urobilinogen, Ur: 0.2
pH, UA: 7

## 2024-12-25 MED ORDER — LEVOCETIRIZINE DIHYDROCHLORIDE 5 MG PO TABS: 5.0000 mg | ORAL_TABLET | Freq: Every evening | ORAL | 2 refills | Status: AC

## 2024-12-25 MED ORDER — GABAPENTIN 100 MG PO CAPS: 200.0000 mg | ORAL_CAPSULE | Freq: Every day | ORAL | 2 refills | Status: AC

## 2024-12-25 NOTE — Addendum Note (Signed)
 Addended by: BULAH ALM RAMAN on: 12/25/2024 12:02 PM   Modules accepted: Orders

## 2024-12-25 NOTE — Progress Notes (Addendum)
 "  Name: [name]   Date of Visit: 12/25/24   CHIEF COMPLAINT:  Chief Complaint  Patient presents with   Follow-up    3 month check. No questions or concerns.        HPI:  Discussed the use of AI scribe software for clinical note transcription with the patient, who gave verbal consent to proceed.  History of Present Illness   [name] is a 54 year old male with hypertension and diabetes who presents for a medication check.  He monitors his blood pressure and blood sugar at home. Blood pressure readings are typically around 135-145 mmHg. Blood sugar has been lower than usual, with a reading of 135 mg/dL this morning. No symptoms of hypoglycemia such as feeling bad, and he feels better overall.  His feet have been feeling cold, a change from previous sensations of heat and swelling. He attributes some discomfort to his shoes, which he has changed recently.  He experiences a sensation of heat in his genital area, particularly when he feels the urge to urinate, but denies any burning sensation during urination. No recent fevers, body aches, or chills.  He is currently taking amlodipine  10 mg, carvedilol  6.25 mg twice a day, valsartan  320/12.5 mg daily for blood pressure, rosuvastatin  10 mg daily for cholesterol, Synjardy  12.03/999 mg twice a day, and Toujeo  10 units daily for diabetes. He also takes Vascepa  twice a day and Xyzal  for allergies.  He acknowledges that his dietary habits could improve, especially following the holiday season, and believes that better eating habits could further improve his blood sugar levels.  His daughter, aged 41, has been diagnosed with breast cancer and is scheduled to receive a port for chemotherapy soon. This has been a source of stress for him recently.  No other aggravating or relieving factors. No other complaint.  Past Medical History:  Diagnosis Date   Diabetes mellitus without complication (HCC)    High cholesterol     Hypertension    Current Outpatient Medications on File Prior to Visit  Medication Sig Dispense Refill   amLODipine  (NORVASC ) 10 MG tablet Take 1 tablet (10 mg total) by mouth daily. 90 tablet 2   carvedilol  (COREG ) 6.25 MG tablet Take 1 tablet (6.25 mg total) by mouth 2 (two) times daily. 180 tablet 2   Empagliflozin -metFORMIN  HCl (SYNJARDY ) 12.03-999 MG TABS Take 1 tablet by mouth 2 (two) times daily. 180 tablet 1   hydrOXYzine  (ATARAX ) 10 MG tablet Take 1 tablet (10 mg total) by mouth 3 (three) times daily as needed. 30 tablet 0   icosapent  Ethyl (VASCEPA ) 1 g capsule Take 2 capsules (2 g total) by mouth 2 (two) times daily. 360 capsule 2   insulin glargine , 1 Unit Dial, (TOUJEO  SOLOSTAR) 300 UNIT/ML Solostar Pen Inject 10 Units into the skin daily. 6 mL 2   Insulin Pen Needle (BD PEN NEEDLE MICRO ULTRAFINE) 32G X 6 MM MISC 1 each by Does not apply route daily. 100 each 2   rosuvastatin  (CRESTOR ) 10 MG tablet Take 1 tablet (10 mg total) by mouth daily. 90 tablet 3   valsartan -hydrochlorothiazide  (DIOVAN -HCT) 320-12.5 MG tablet Take 1 tablet by mouth daily. 90 tablet 2   triamcinolone  cream (KENALOG ) 0.1 % APPLY TO AFFECTED AREA TWICE A DAY (Patient not taking: Reported on 12/25/2024) 30 g 0   No current facility-administered medications on file prior to visit.     OBJECTIVE:    BP 130/82   Pulse ROLLEN)  58   Wt 211 lb 9.6 oz (96 kg)   SpO2 99%   BMI 34.15 kg/m    BP Readings from Last 3 Encounters:  12/25/24 130/82  09/24/24 122/80  06/24/24 134/80   Wt Readings from Last 3 Encounters:  12/25/24 211 lb 9.6 oz (96 kg)  09/24/24 217 lb 9.6 oz (98.7 kg)  06/24/24 213 lb 3.2 oz (96.7 kg)    General appearence: alert, no distress, WD/WN,  Heart: RRR, normal S1, S2, no murmurs Lungs: CTA bilaterally, no wheezes, rhonchi, or rales Abdomen: +bs, soft, non tender, non distended, no masses, no hepatomegaly, no splenomegaly Extremities: no edema, no cyanosis, no clubbing Pulses: 2+  symmetric, upper and lower extremities, normal cap refill GU: Normal male, uncircumcised, no mass no lymphadenopathy, no hernia, testicles nontender, pubic area nontender  Diabetic Foot Exam - Simple   Simple Foot Form Diabetic Foot exam was performed with the following findings: Yes 12/25/2024 10:47 AM  Visual Inspection See comments: Yes Sensation Testing Intact to touch and monofilament testing bilaterally: Yes Pulse Check See comments: Yes Comments 1+ pedal pulses, hypertrophic toenails throughout the particular with the great toenails and some discoloration        ASSESSMENT/PLAN:   Encounter Diagnoses  Name Primary?   Type 2 diabetes mellitus with diabetic neuropathy, unspecified whether long term insulin use (HCC) Yes   Primary hypertension    Seasonal allergic rhinitis due to pollen    Screening for prostate cancer    Hyperlipidemia associated with type 2 diabetes mellitus (HCC)    Seasonal allergic rhinitis, unspecified trigger    Dysuria    Need for pneumococcal 20-valent conjugate vaccination    Need for hepatitis B vaccination    Decreased pedal pulses    Need for vaccination against Streptococcus pneumoniae    Need for hepatitis vaccination     Type 2 diabetes mellitus with diabetic neuropathy Blood sugar levels improved. Neuropathy symptoms persist but have improved. Previous A1c not at target. - Ordered A1c test.  At his last visit his hemoglobin A1c had increased to 10% unfortunately.  He does note dietary indiscretion -Continue Synjardy  12.03/999 mg twice daily and Toujeo  10 units daily - Ordered microalbumin test. - Referred to vascular office for blood flow screening. - Prescribed gabapentin  for neuropathy symptoms, starting at nighttime only.  Primary hypertension Blood pressure well-controlled with current medication regimen. - Continue current antihypertensive medications: amlodipine , carvedilol , valsartan . -Continue amlodipine  10 mg  daily -Continue carvedilol  6.25 mg twice daily -Continue valsartan  HCT 320/12.5 mg daily  Hyperlipidemia associated with type 2 diabetes mellitus Cholesterol levels satisfactory with current medication regimen. - Continue current cholesterol medications: rosuvastatin , Vascepa . -Continue Crestor  10 mg daily -Continue Vascepa  1 g, 2 tablets twice daily  Dysuria Reports increased urinary frequency and feeling hot in genital area. Possible urinary tract infection considered. - Performed urine dipstick test to check for infection.  Seasonal allergic rhinitis Experiencing allergy symptoms due to seasonal changes. Missed allergy specialist appointment. - Refilled Xyzal  prescription.  General Health Maintenance Due for pneumonia and hepatitis B vaccines. Missed eye doctor appointment. - Administered pneumococcal vaccine. - Administered hepatitis B vaccine. - Referred to eye doctor for routine examination.   Sonia was seen today for follow-up.  Diagnoses and all orders for this visit:  Type 2 diabetes mellitus with diabetic neuropathy, unspecified whether long term insulin use (HCC) -     Microalbumin/Creatinine Ratio, Urine -     Hemoglobin A1c -     Ambulatory referral to  Ophthalmology -     VAS US  ABI WITH/WO TBI; Future  Primary hypertension -     VAS US  ABI WITH/WO TBI; Future  Seasonal allergic rhinitis due to pollen  Screening for prostate cancer -     PSA  Hyperlipidemia associated with type 2 diabetes mellitus (HCC)  Seasonal allergic rhinitis, unspecified trigger -     levocetirizine (XYZAL ) 5 MG tablet; Take 1 tablet (5 mg total) by mouth every evening.  Dysuria -     POCT Urinalysis DIP (Proadvantage Device) -     CBC -     Urine Culture  Need for pneumococcal 20-valent conjugate vaccination -     Pneumococcal conjugate vaccine 20-valent (PCV20)  Need for hepatitis B vaccination -     Heplisav-B (HepB-CPG) Vaccine  Decreased pedal pulses -     VAS US  ABI  WITH/WO TBI; Future  Need for vaccination against Streptococcus pneumoniae -     Pneumococcal conjugate vaccine 20-valent (PCV20)  Need for hepatitis vaccination -     Heplisav-B (HepB-CPG) Vaccine  Other orders -     gabapentin  (NEURONTIN ) 100 MG capsule; Take 2 capsules (200 mg total) by mouth at bedtime.   F/u pending labs   [facility] "

## 2024-12-26 ENCOUNTER — Other Ambulatory Visit: Payer: Self-pay | Admitting: Medical

## 2024-12-26 ENCOUNTER — Ambulatory Visit: Payer: Self-pay | Admitting: Medical

## 2024-12-26 LAB — MICROALBUMIN / CREATININE URINE RATIO
Creatinine, Urine: 55.8 mg/dL
Microalb/Creat Ratio: 65 mg/g{creat} — ABNORMAL HIGH (ref 0–29)
Microalbumin, Urine: 36.1 ug/mL

## 2024-12-26 LAB — CBC
Hematocrit: 49.5 % (ref 37.5–51.0)
Hemoglobin: 15.2 g/dL (ref 13.0–17.7)
MCH: 23.8 pg — ABNORMAL LOW (ref 26.6–33.0)
MCHC: 30.7 g/dL — ABNORMAL LOW (ref 31.5–35.7)
MCV: 78 fL — ABNORMAL LOW (ref 79–97)
Platelets: 284 10*3/uL (ref 150–450)
RBC: 6.38 x10E6/uL — ABNORMAL HIGH (ref 4.14–5.80)
RDW: 16.9 % — ABNORMAL HIGH (ref 11.6–15.4)
WBC: 8.3 10*3/uL (ref 3.4–10.8)

## 2024-12-26 LAB — PSA: Prostate Specific Ag, Serum: 0.4 ng/mL (ref 0.0–4.0)

## 2024-12-26 LAB — HEMOGLOBIN A1C
Est. average glucose Bld gHb Est-mCnc: 192 mg/dL
Hgb A1c MFr Bld: 8.3 % — ABNORMAL HIGH (ref 4.8–5.6)

## 2024-12-26 MED ORDER — TOUJEO SOLOSTAR 300 UNIT/ML ~~LOC~~ SOPN: 20.0000 [IU] | PEN_INJECTOR | Freq: Every day | SUBCUTANEOUS | 3 refills | Status: DC

## 2024-12-26 NOTE — Progress Notes (Signed)
 Results through MyChart

## 2024-12-26 NOTE — Telephone Encounter (Signed)
 SABRA

## 2024-12-27 ENCOUNTER — Other Ambulatory Visit: Payer: Self-pay | Admitting: Medical

## 2024-12-27 LAB — URINE CULTURE

## 2024-12-27 MED ORDER — TOUJEO SOLOSTAR 300 UNIT/ML ~~LOC~~ SOPN: 20.0000 [IU] | PEN_INJECTOR | Freq: Every day | SUBCUTANEOUS | 3 refills | Status: AC

## 2024-12-27 NOTE — Progress Notes (Signed)
 Results through my chart

## 2025-01-01 ENCOUNTER — Ambulatory Visit (HOSPITAL_COMMUNITY)

## 2025-01-02 ENCOUNTER — Ambulatory Visit (HOSPITAL_COMMUNITY): Admission: RE | Admit: 2025-01-02

## 2025-01-02 DIAGNOSIS — E1140 Type 2 diabetes mellitus with diabetic neuropathy, unspecified: Secondary | ICD-10-CM

## 2025-01-02 DIAGNOSIS — R0989 Other specified symptoms and signs involving the circulatory and respiratory systems: Secondary | ICD-10-CM

## 2025-01-02 DIAGNOSIS — I10 Essential (primary) hypertension: Secondary | ICD-10-CM

## 2025-01-02 LAB — VAS US ABI WITH/WO TBI
Left ABI: 1.21
Right ABI: 1.14

## 2025-01-02 NOTE — Progress Notes (Signed)
 Results through MyChart

## 2025-04-22 ENCOUNTER — Ambulatory Visit: Admitting: Medical
